# Patient Record
Sex: Male | Born: 1950 | Race: White | Hispanic: No | Smoking: Never smoker
Health system: Southern US, Community
[De-identification: ages and names within clinical notes are randomized; demographics above are authoritative.]

## PROBLEM LIST (undated history)

## (undated) DIAGNOSIS — E1121 Type 2 diabetes mellitus with diabetic nephropathy: Secondary | ICD-10-CM

## (undated) DIAGNOSIS — E11319 Type 2 diabetes mellitus with unspecified diabetic retinopathy without macular edema: Secondary | ICD-10-CM

## (undated) DIAGNOSIS — E119 Type 2 diabetes mellitus without complications: Secondary | ICD-10-CM

## (undated) DIAGNOSIS — I10 Essential (primary) hypertension: Secondary | ICD-10-CM

## (undated) DIAGNOSIS — Z87442 Personal history of urinary calculi: Secondary | ICD-10-CM

## (undated) DIAGNOSIS — L570 Actinic keratosis: Secondary | ICD-10-CM

## (undated) HISTORY — DX: Actinic keratosis: L57.0

## (undated) HISTORY — PX: EYE SURGERY: SHX253

## (undated) HISTORY — PX: CORONARY ANGIOPLASTY: SHX604

## (undated) HISTORY — PX: PILONIDAL CYST EXCISION: SHX744

---
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
## 2006-11-06 ENCOUNTER — Ambulatory Visit (HOSPITAL_COMMUNITY): Admission: RE | Admit: 2006-11-06 | Discharge: 2006-11-06 | Payer: Self-pay | Admitting: Ophthalmology

## 2006-11-06 IMAGING — CR DG CHEST 2V
2 series · 2 of 2 positions shown · non-contrast
Comparison: None.

CLINICAL DATA: Preop.
 CHEST ? 2 VIEW:

[view not recorded (1 of 2)]
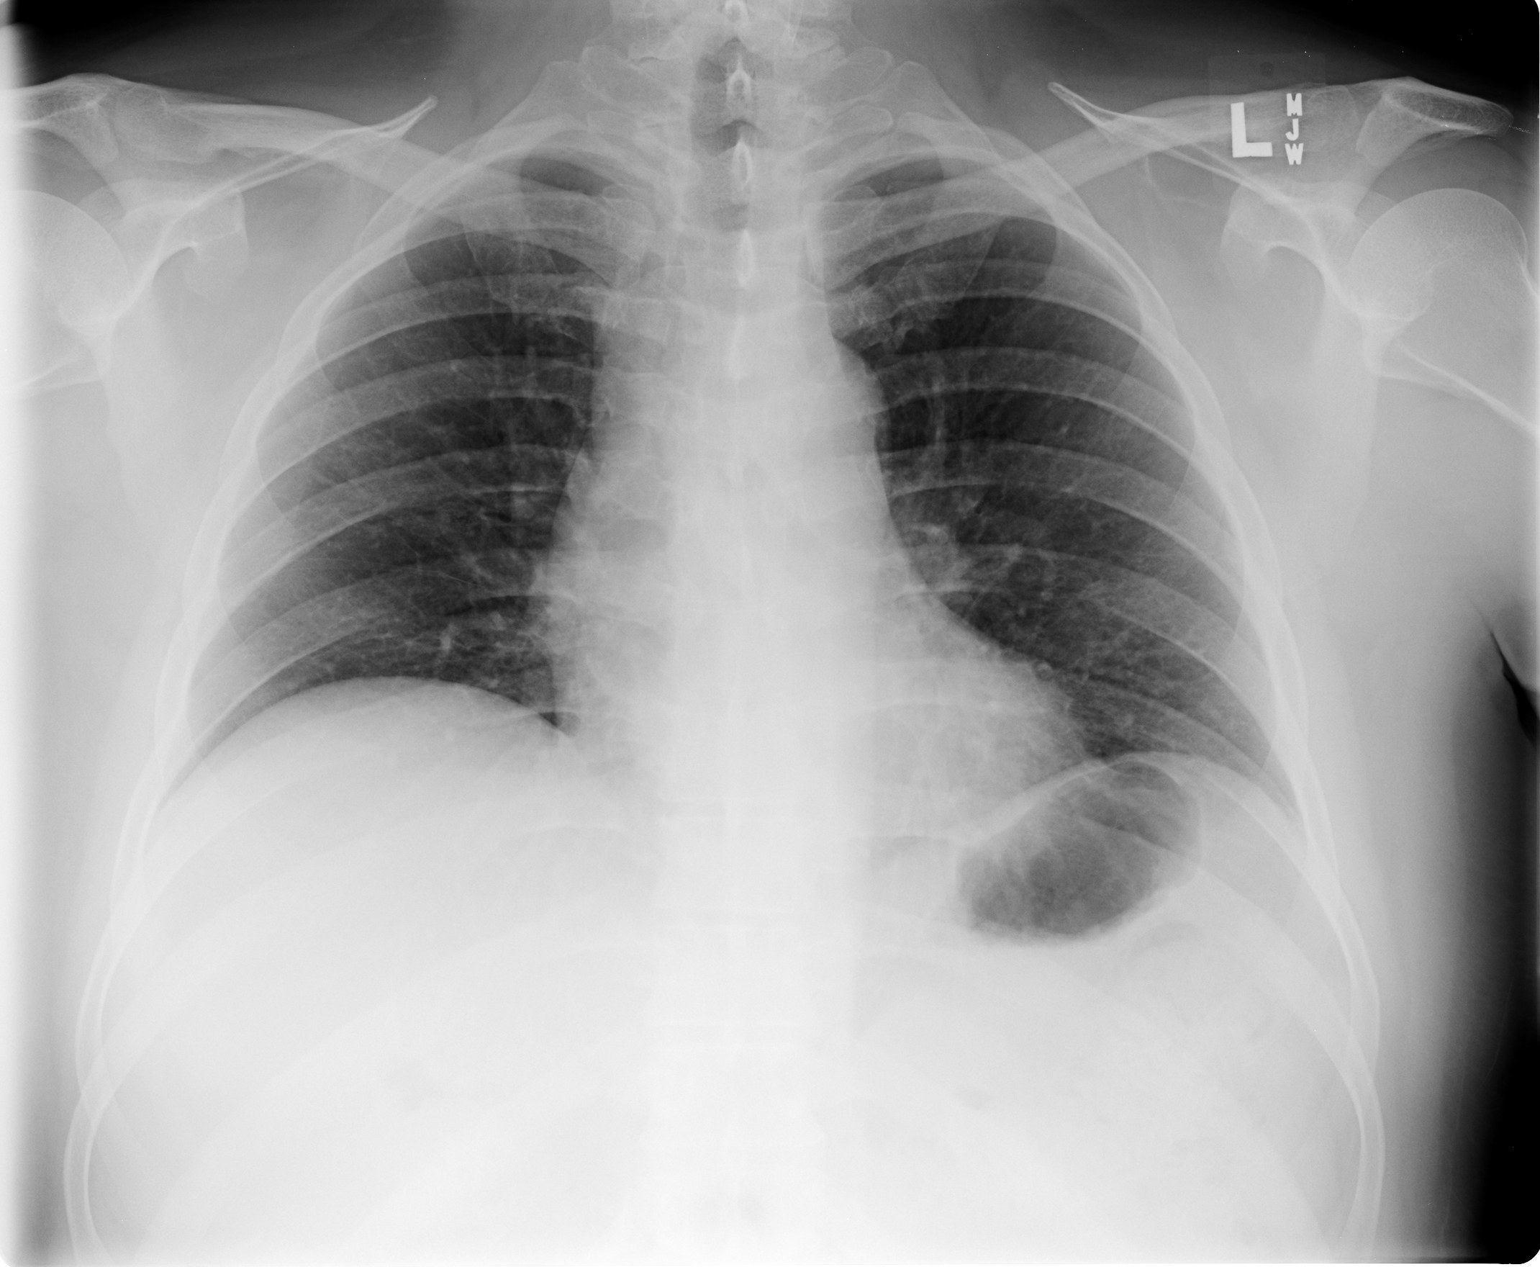

[view not recorded (2 of 2)]
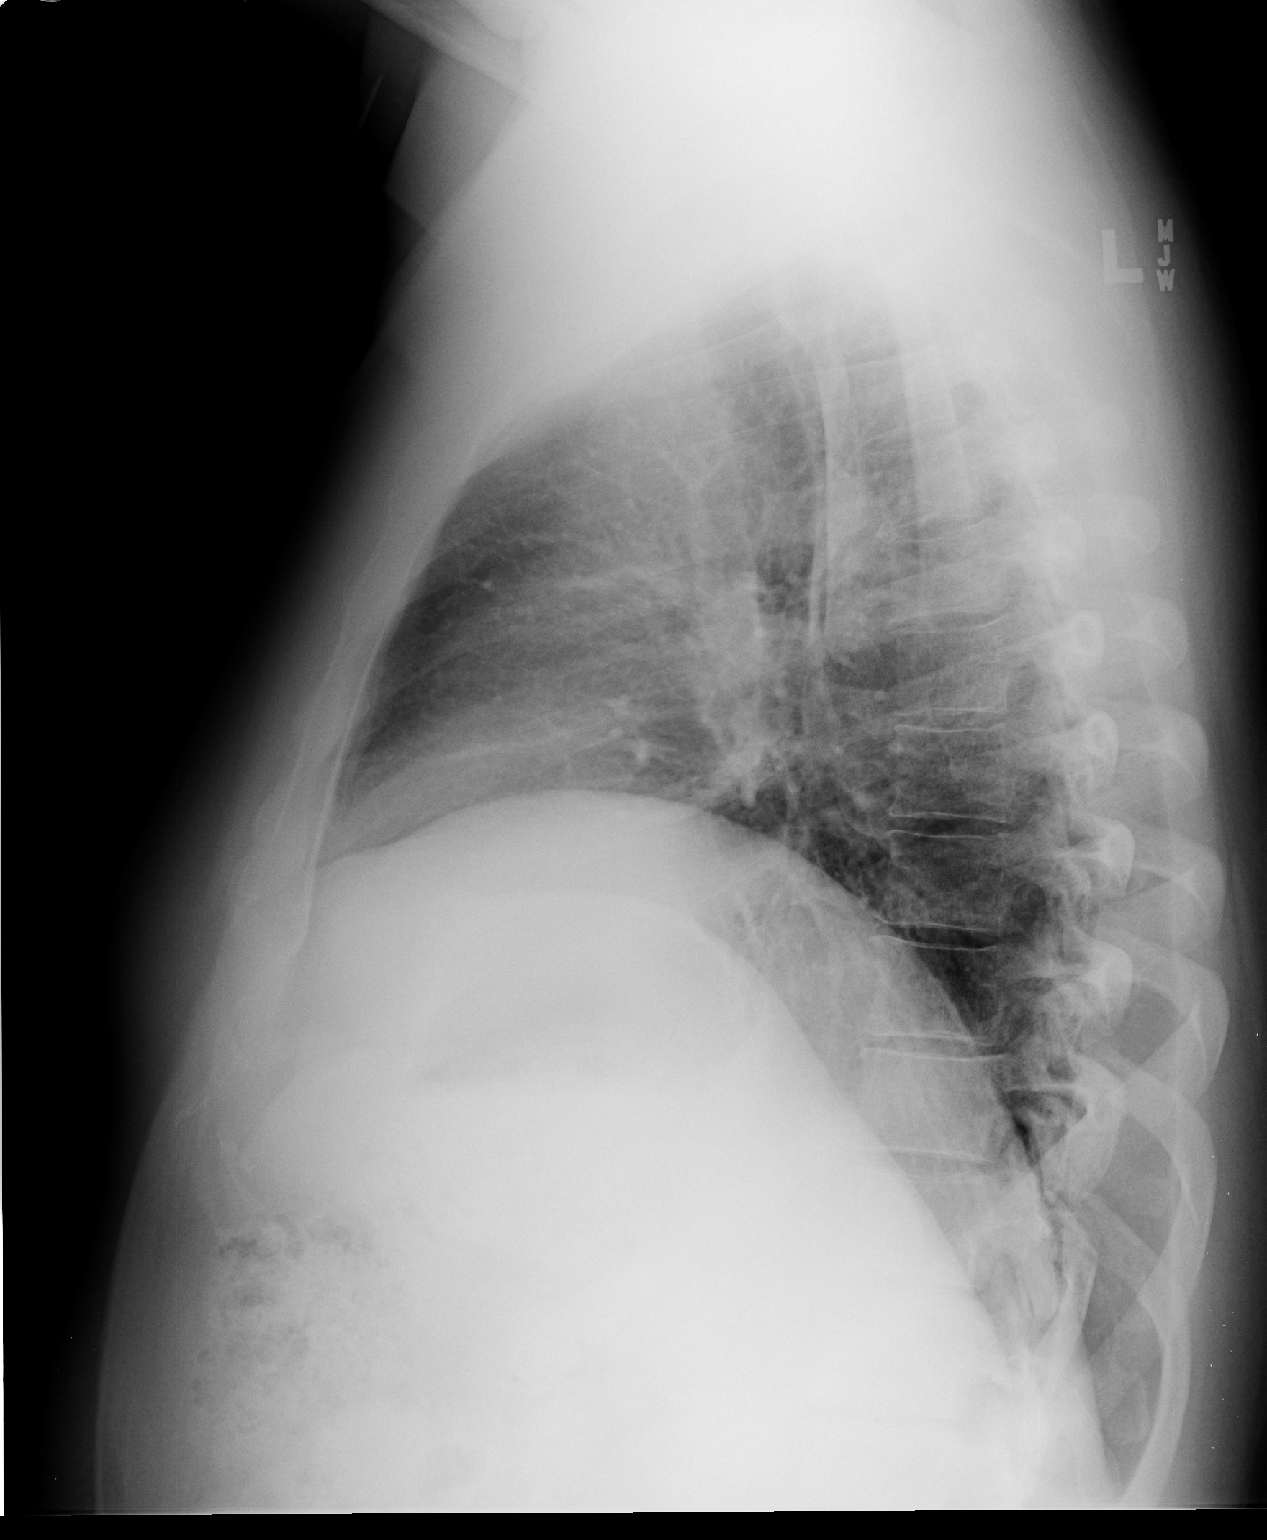

[2 of 2 positions shown; findings below may reference images not displayed]

FINDINGS: The trachea is midline.  Heart size is normal.  Lungs are low in volume but clear.  No pleural fluid.
IMPRESSION: No acute findings.

## 2008-09-23 ENCOUNTER — Ambulatory Visit: Payer: Self-pay | Admitting: Unknown Physician Specialty

## 2008-10-02 ENCOUNTER — Ambulatory Visit: Payer: Self-pay | Admitting: Unknown Physician Specialty

## 2014-07-04 DIAGNOSIS — I219 Acute myocardial infarction, unspecified: Secondary | ICD-10-CM

## 2014-07-04 HISTORY — DX: Acute myocardial infarction, unspecified: I21.9

## 2015-01-13 ENCOUNTER — Inpatient Hospital Stay
Admission: EM | Admit: 2015-01-13 | Discharge: 2015-01-16 | DRG: 247 | Disposition: A | Discharge status: Home / Self Care | Payer: 59 | Attending: Internal Medicine | Admitting: Internal Medicine

## 2015-01-13 ENCOUNTER — Encounter: Payer: Self-pay | Admitting: Emergency Medicine

## 2015-01-13 ENCOUNTER — Other Ambulatory Visit
Admission: RE | Admit: 2015-01-13 | Discharge: 2015-01-13 | Disposition: A | Discharge status: Home / Self Care | Payer: 59 | Source: Other Acute Inpatient Hospital | Attending: Physician Assistant | Admitting: Physician Assistant

## 2015-01-13 ENCOUNTER — Emergency Department: Payer: 59

## 2015-01-13 DIAGNOSIS — R6510 Systemic inflammatory response syndrome (SIRS) of non-infectious origin without acute organ dysfunction: Secondary | ICD-10-CM

## 2015-01-13 DIAGNOSIS — R7989 Other specified abnormal findings of blood chemistry: Secondary | ICD-10-CM

## 2015-01-13 DIAGNOSIS — M2550 Pain in unspecified joint: Secondary | ICD-10-CM

## 2015-01-13 DIAGNOSIS — I2510 Atherosclerotic heart disease of native coronary artery without angina pectoris: Secondary | ICD-10-CM | POA: Diagnosis present

## 2015-01-13 DIAGNOSIS — I10 Essential (primary) hypertension: Secondary | ICD-10-CM | POA: Diagnosis present

## 2015-01-13 DIAGNOSIS — R001 Bradycardia, unspecified: Secondary | ICD-10-CM | POA: Insufficient documentation

## 2015-01-13 DIAGNOSIS — R9431 Abnormal electrocardiogram [ECG] [EKG]: Secondary | ICD-10-CM | POA: Insufficient documentation

## 2015-01-13 DIAGNOSIS — I319 Disease of pericardium, unspecified: Secondary | ICD-10-CM

## 2015-01-13 DIAGNOSIS — I214 Non-ST elevation (NSTEMI) myocardial infarction: Secondary | ICD-10-CM | POA: Diagnosis present

## 2015-01-13 DIAGNOSIS — E785 Hyperlipidemia, unspecified: Secondary | ICD-10-CM | POA: Diagnosis present

## 2015-01-13 DIAGNOSIS — E119 Type 2 diabetes mellitus without complications: Secondary | ICD-10-CM | POA: Diagnosis present

## 2015-01-13 DIAGNOSIS — Z7982 Long term (current) use of aspirin: Secondary | ICD-10-CM | POA: Diagnosis not present

## 2015-01-13 DIAGNOSIS — R778 Other specified abnormalities of plasma proteins: Secondary | ICD-10-CM | POA: Diagnosis present

## 2015-01-13 HISTORY — DX: Essential (primary) hypertension: I10

## 2015-01-13 HISTORY — DX: Type 2 diabetes mellitus without complications: E11.9

## 2015-01-13 LAB — URINALYSIS COMPLETE WITH MICROSCOPIC (ARMC ONLY)
BILIRUBIN URINE: NEGATIVE
Bacteria, UA: NONE SEEN
Hgb urine dipstick: NEGATIVE
Leukocytes, UA: NEGATIVE
Nitrite: NEGATIVE
PH: 5 (ref 5.0–8.0)
Protein, ur: NEGATIVE mg/dL
Specific Gravity, Urine: 1.025 (ref 1.005–1.030)
Squamous Epithelial / LPF: NONE SEEN

## 2015-01-13 LAB — BASIC METABOLIC PANEL
Anion gap: 8 (ref 5–15)
BUN: 33 mg/dL — AB (ref 6–20)
CHLORIDE: 103 mmol/L (ref 101–111)
CO2: 25 mmol/L (ref 22–32)
CREATININE: 1.45 mg/dL — AB (ref 0.61–1.24)
Calcium: 9.2 mg/dL (ref 8.9–10.3)
GFR, EST AFRICAN AMERICAN: 58 mL/min — AB (ref >60)
GFR, EST NON AFRICAN AMERICAN: 50 mL/min — AB (ref >60)
Glucose, Bld: 193 mg/dL — ABNORMAL HIGH (ref 65–99)
Potassium: 4.3 mmol/L (ref 3.5–5.1)
Sodium: 136 mmol/L (ref 135–145)

## 2015-01-13 LAB — CK: Total CK: 139 U/L (ref 49–397)

## 2015-01-13 LAB — CBC
HCT: 41.5 % (ref 40.0–52.0)
HEMOGLOBIN: 13.9 g/dL (ref 13.0–18.0)
MCH: 29.6 pg (ref 26.0–34.0)
MCHC: 33.6 g/dL (ref 32.0–36.0)
MCV: 88.3 fL (ref 80.0–100.0)
PLATELETS: 263 10*3/uL (ref 150–440)
RBC: 4.7 MIL/uL (ref 4.40–5.90)
RDW: 13.3 % (ref 11.5–14.5)
WBC: 7.6 10*3/uL (ref 3.8–10.6)

## 2015-01-13 LAB — APTT: APTT: 29 s (ref 24–36)

## 2015-01-13 LAB — TROPONIN I
TROPONIN I: 5.41 ng/mL — AB (ref <0.031)
Troponin I: 4.66 ng/mL — ABNORMAL HIGH (ref <0.031)

## 2015-01-13 LAB — HEPATIC FUNCTION PANEL
ALBUMIN: 4 g/dL (ref 3.5–5.0)
ALK PHOS: 56 U/L (ref 38–126)
ALT: 25 U/L (ref 17–63)
AST: 34 U/L (ref 15–41)
BILIRUBIN TOTAL: 0.8 mg/dL (ref 0.3–1.2)
Bilirubin, Direct: <0.1 mg/dL — ABNORMAL LOW (ref 0.1–0.5)
TOTAL PROTEIN: 7.8 g/dL (ref 6.5–8.1)

## 2015-01-13 LAB — PROTIME-INR
INR: 0.97
PROTHROMBIN TIME: 13.1 s (ref 11.4–15.0)

## 2015-01-13 LAB — CKMB (ARMC ONLY): CK, MB: 7.8 ng/mL — AB (ref 0.5–5.0)

## 2015-01-13 LAB — LACTIC ACID, PLASMA: Lactic Acid, Venous: 1.3 mmol/L (ref 0.5–2.0)

## 2015-01-13 IMAGING — CR DG CHEST 2V
2 series · 2 of 2 positions shown · non-contrast
Comparison: 11/06/2006

CLINICAL DATA: Abnormal EKG. Elevated cardiac enzymes. Diabetes and
hypertension.

EXAM:
CHEST  2 VIEW

[chest pa]
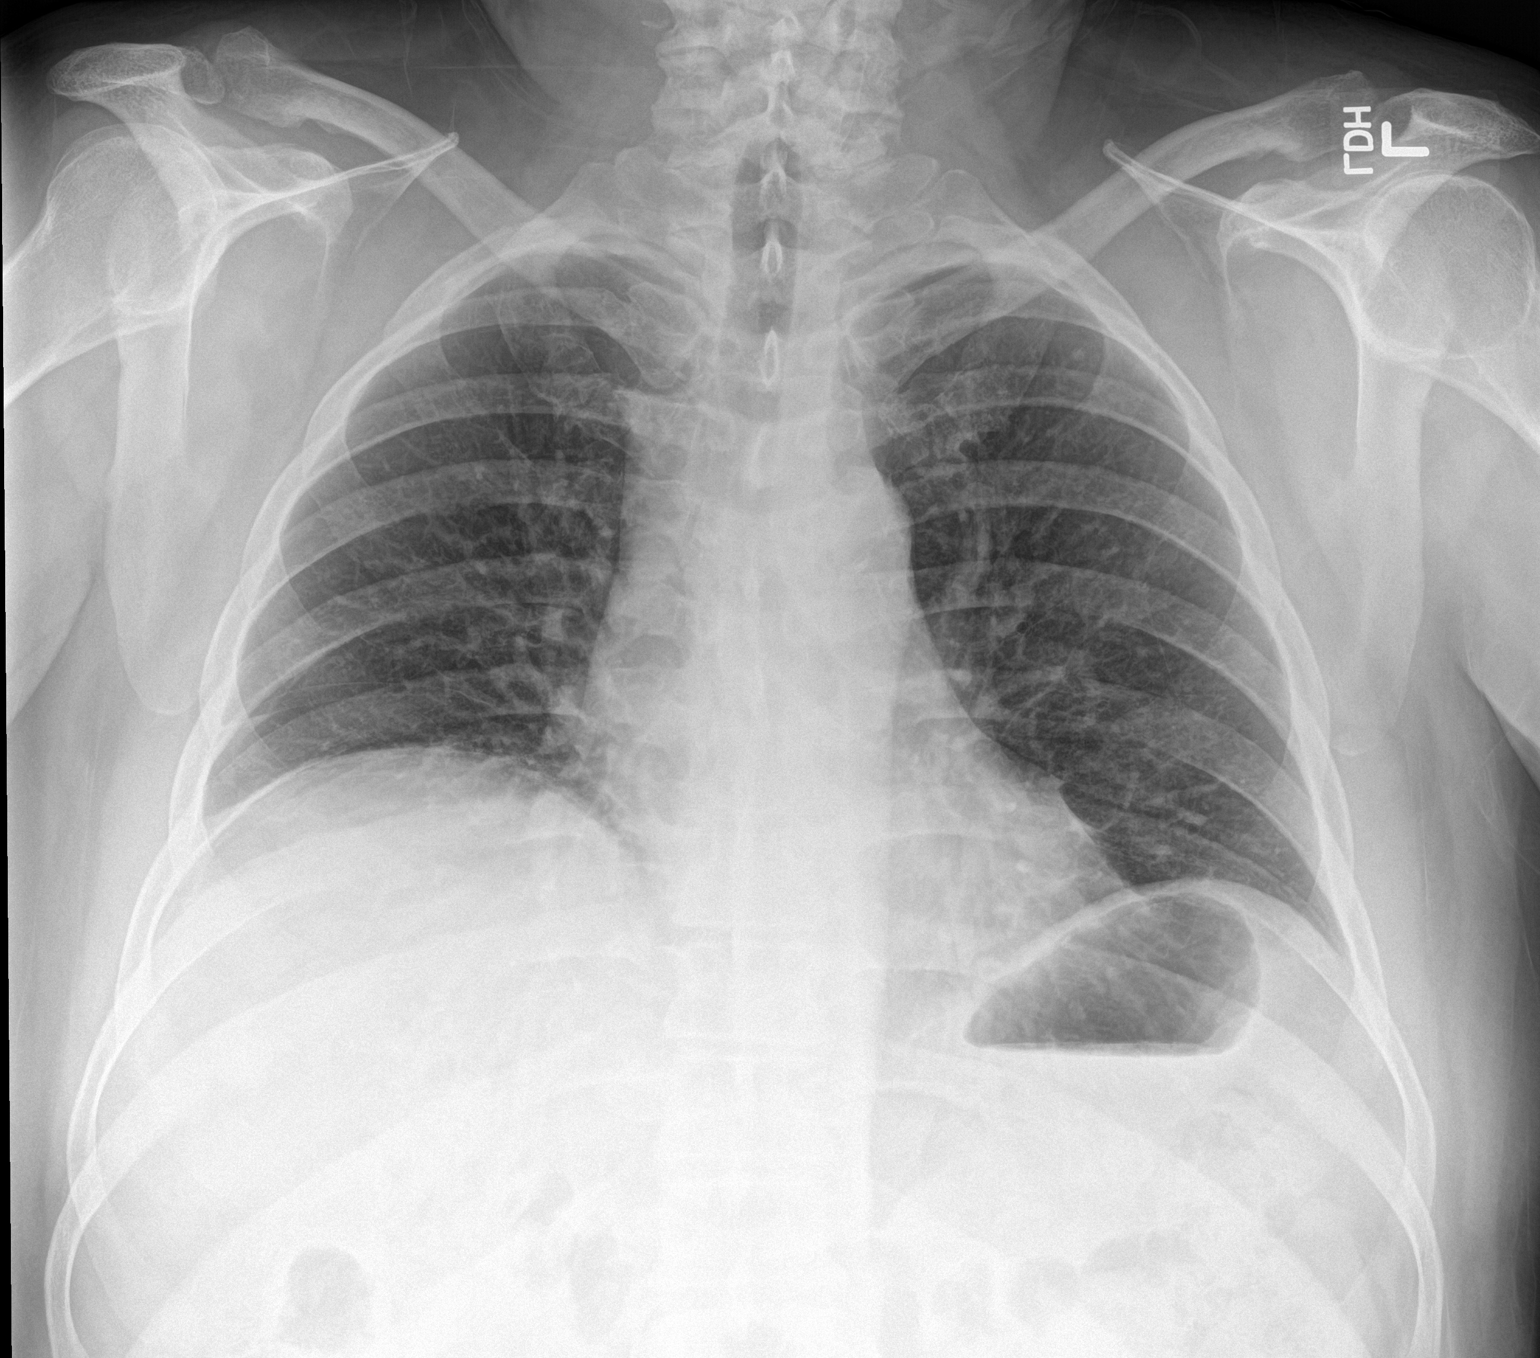

[chest lat]
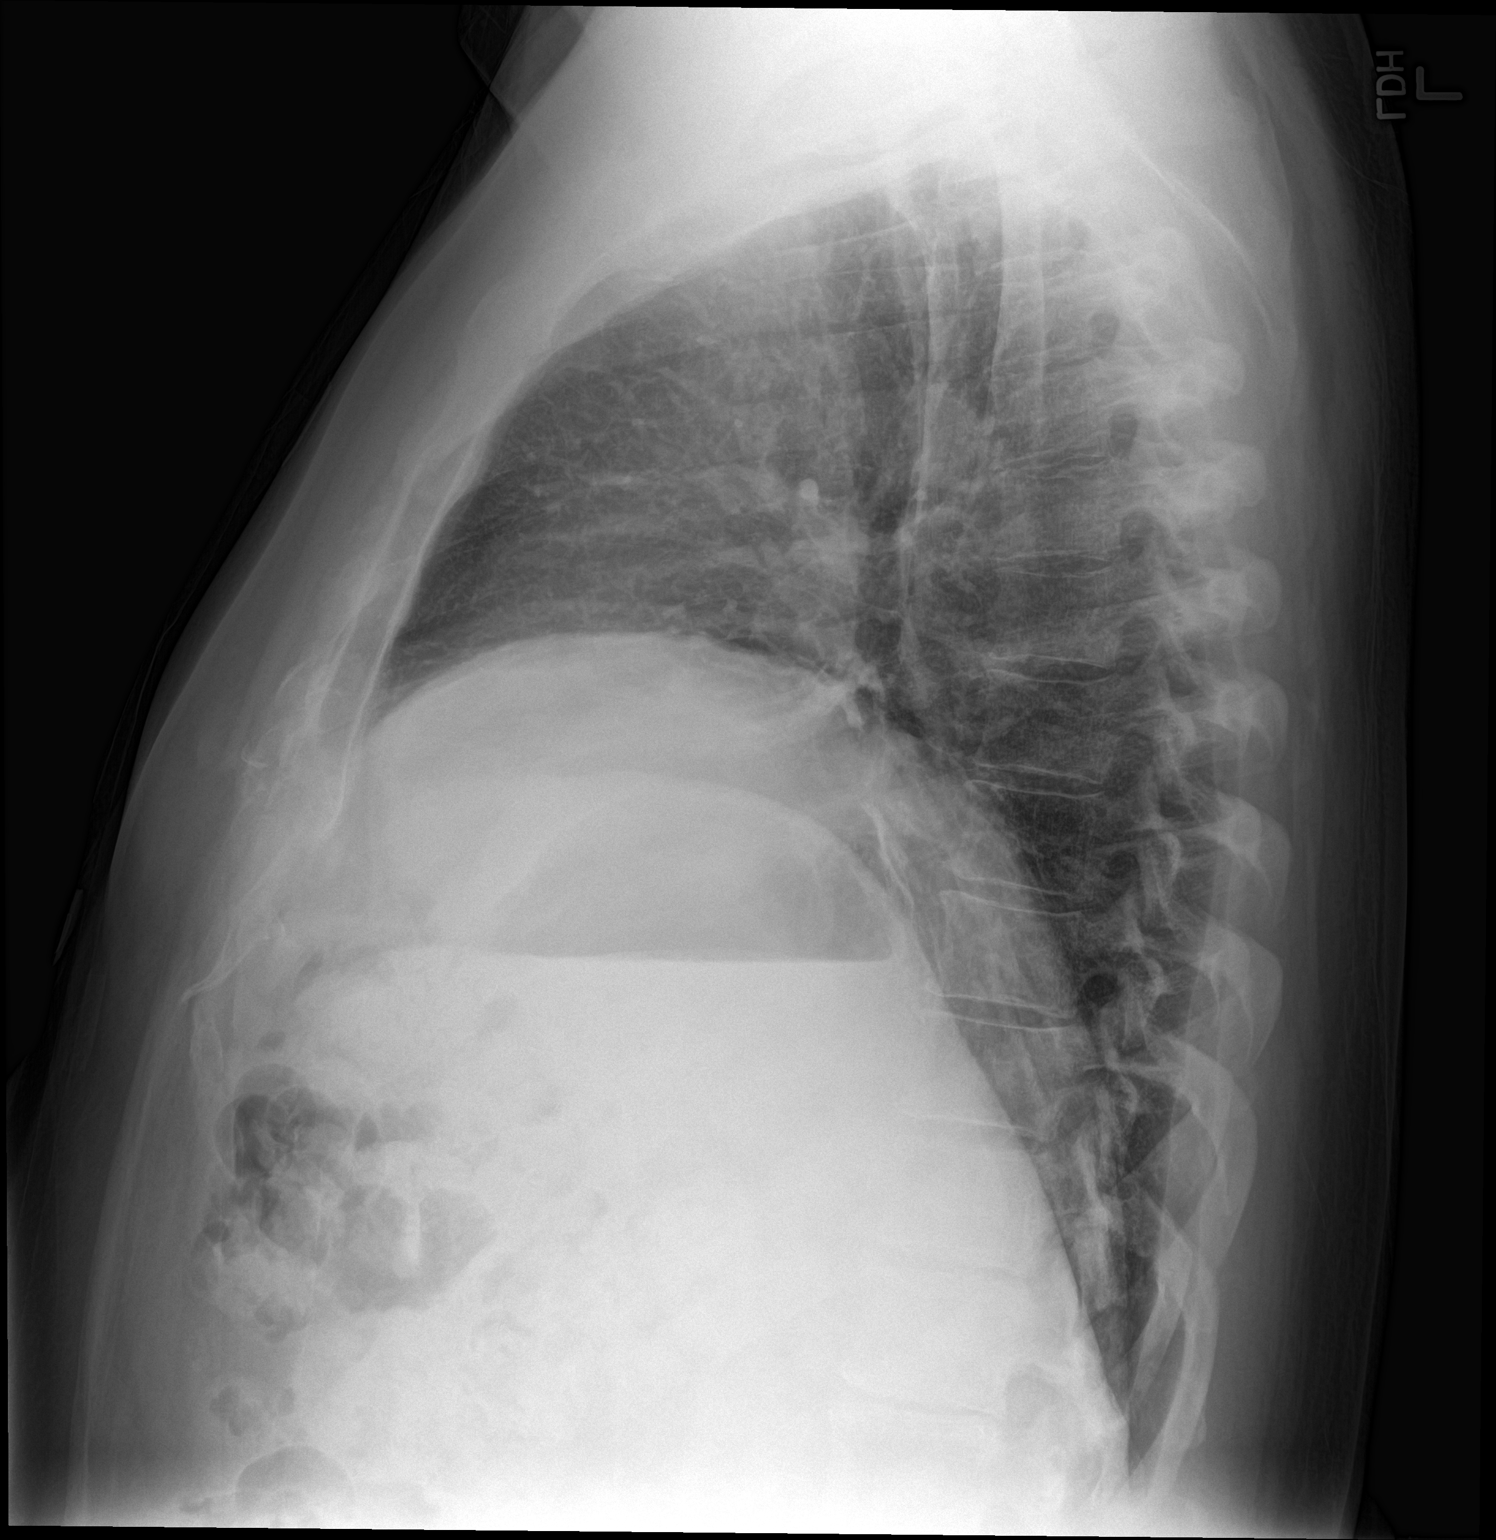

[2 of 2 positions shown; findings below may reference images not displayed]

FINDINGS: Low lung volumes again noted, however both lungs are clear. No
evidence of pleural effusion. Heart size is within normal limits. No
definite masses or lymphadenopathy identified.
IMPRESSION: Low lung volumes.  No active disease.

## 2015-01-13 MED ORDER — ASPIRIN EC 81 MG PO TBEC
81.0000 mg | DELAYED_RELEASE_TABLET | Freq: Every day | ORAL | Status: DC
  Administered 2015-01-14: 81 mg via ORAL
  Filled 2015-01-13: qty 1

## 2015-01-13 MED ORDER — GLIMEPIRIDE 2 MG PO TABS
2.0000 mg | ORAL_TABLET | Freq: Every day | ORAL | Status: DC
  Administered 2015-01-14 – 2015-01-16 (×3): 2 mg via ORAL
  Filled 2015-01-13 (×3): qty 1

## 2015-01-13 MED ORDER — BRIMONIDINE TARTRATE 0.2 % OP SOLN
1.0000 [drp] | Freq: Two times a day (BID) | OPHTHALMIC | Status: DC
  Administered 2015-01-14 – 2015-01-16 (×4): 1 [drp] via OPHTHALMIC
  Filled 2015-01-13: qty 5

## 2015-01-13 MED ORDER — SODIUM CHLORIDE 0.9 % IV BOLUS (SEPSIS)
1000.0000 mL | Freq: Once | INTRAVENOUS | Status: AC
  Administered 2015-01-13: 1000 mL via INTRAVENOUS
  Filled 2015-01-13: qty 1000

## 2015-01-13 MED ORDER — PIOGLITAZONE HCL 30 MG PO TABS
30.0000 mg | ORAL_TABLET | Freq: Every day | ORAL | Status: DC
  Administered 2015-01-14: 30 mg via ORAL
  Filled 2015-01-13: qty 1

## 2015-01-13 MED ORDER — VANCOMYCIN HCL IN DEXTROSE 1-5 GM/200ML-% IV SOLN
1000.0000 mg | Freq: Once | INTRAVENOUS | Status: AC
  Administered 2015-01-13: 1000 mg via INTRAVENOUS
  Filled 2015-01-13: qty 200

## 2015-01-13 MED ORDER — INSULIN ASPART 100 UNIT/ML ~~LOC~~ SOLN
0.0000 [IU] | Freq: Three times a day (TID) | SUBCUTANEOUS | Status: DC
  Filled 2015-01-13: qty 2

## 2015-01-13 MED ORDER — ASPIRIN 81 MG PO CHEW
324.0000 mg | CHEWABLE_TABLET | Freq: Once | ORAL | Status: AC
  Administered 2015-01-13: 324 mg via ORAL
  Filled 2015-01-13: qty 4

## 2015-01-13 MED ORDER — ASPIRIN EC 81 MG PO TBEC: 81.0000 mg | DELAYED_RELEASE_TABLET | Freq: Every day | ORAL | Status: DC

## 2015-01-13 MED ORDER — ACETAMINOPHEN 325 MG PO TABS: 650.0000 mg | ORAL_TABLET | Freq: Four times a day (QID) | ORAL | Status: DC | PRN

## 2015-01-13 MED ORDER — SODIUM CHLORIDE 0.9 % IJ SOLN
3.0000 mL | INTRAMUSCULAR | Status: DC | PRN
  Administered 2015-01-14: 3 mL via INTRAVENOUS
  Filled 2015-01-13: qty 10

## 2015-01-13 MED ORDER — SODIUM CHLORIDE 0.9 % IJ SOLN
3.0000 mL | Freq: Two times a day (BID) | INTRAMUSCULAR | Status: DC
  Administered 2015-01-13 – 2015-01-16 (×5): 3 mL via INTRAVENOUS

## 2015-01-13 MED ORDER — VALSARTAN 160 MG PO TABS
160.0000 mg | ORAL_TABLET | Freq: Every day | ORAL | Status: DC
  Administered 2015-01-15 – 2015-01-16 (×2): 160 mg via ORAL
  Filled 2015-01-13 (×3): qty 1

## 2015-01-13 MED ORDER — VALSARTAN-HYDROCHLOROTHIAZIDE 160-12.5 MG PO TABS: 1.0000 | ORAL_TABLET | Freq: Every day | ORAL | Status: DC

## 2015-01-13 MED ORDER — METFORMIN HCL 500 MG PO TABS
1000.0000 mg | ORAL_TABLET | Freq: Two times a day (BID) | ORAL | Status: DC
  Administered 2015-01-14 (×2): 1000 mg via ORAL
  Filled 2015-01-13 (×2): qty 2

## 2015-01-13 MED ORDER — LIRAGLUTIDE 18 MG/3ML ~~LOC~~ SOPN
1.8000 mg | PEN_INJECTOR | Freq: Every day | SUBCUTANEOUS | Status: DC
  Administered 2015-01-14 – 2015-01-15 (×2): 1.8 mg via SUBCUTANEOUS
  Filled 2015-01-13 (×3): qty 0.3

## 2015-01-13 MED ORDER — ACETAMINOPHEN 650 MG RE SUPP
650.0000 mg | Freq: Four times a day (QID) | RECTAL | Status: DC | PRN
Start: 2015-01-13 — End: 2015-01-16

## 2015-01-13 MED ORDER — HYDROCHLOROTHIAZIDE 12.5 MG PO CAPS: 12.5000 mg | ORAL_CAPSULE | Freq: Every day | ORAL | Status: DC

## 2015-01-13 MED ORDER — PIPERACILLIN-TAZOBACTAM 3.375 G IVPB
3.3750 g | Freq: Once | INTRAVENOUS | Status: AC
  Administered 2015-01-13: 3.375 g via INTRAVENOUS
  Filled 2015-01-13: qty 50

## 2015-01-13 MED ORDER — ADULT MULTIVITAMIN W/MINERALS CH
1.0000 | ORAL_TABLET | Freq: Every day | ORAL | Status: DC
  Administered 2015-01-14 – 2015-01-16 (×3): 1 via ORAL
  Filled 2015-01-13 (×3): qty 1

## 2015-01-13 MED ORDER — ONDANSETRON HCL 4 MG PO TABS: 4.0000 mg | ORAL_TABLET | Freq: Four times a day (QID) | ORAL | Status: DC | PRN

## 2015-01-13 MED ORDER — CANAGLIFLOZIN 100 MG PO TABS
300.0000 mg | ORAL_TABLET | Freq: Every day | ORAL | Status: DC
  Administered 2015-01-14 – 2015-01-16 (×3): 300 mg via ORAL
  Filled 2015-01-13 (×3): qty 3

## 2015-01-13 MED ORDER — ENOXAPARIN SODIUM 40 MG/0.4ML ~~LOC~~ SOLN
40.0000 mg | SUBCUTANEOUS | Status: DC
  Administered 2015-01-13: 40 mg via SUBCUTANEOUS
  Filled 2015-01-13: qty 0.4

## 2015-01-13 MED ORDER — DOXYCYCLINE HYCLATE 100 MG PO TABS
100.0000 mg | ORAL_TABLET | Freq: Two times a day (BID) | ORAL | Status: DC
  Administered 2015-01-14 (×2): 100 mg via ORAL
  Filled 2015-01-13 (×3): qty 1

## 2015-01-13 MED ORDER — ONDANSETRON HCL 4 MG/2ML IJ SOLN: 4.0000 mg | Freq: Four times a day (QID) | INTRAMUSCULAR | Status: DC | PRN

## 2015-01-13 MED ORDER — OXYCODONE HCL 5 MG PO TABS: 5.0000 mg | ORAL_TABLET | ORAL | Status: DC | PRN

## 2015-01-13 MED ORDER — ATORVASTATIN CALCIUM 20 MG PO TABS
20.0000 mg | ORAL_TABLET | Freq: Every day | ORAL | Status: DC
  Administered 2015-01-14: 20 mg via ORAL
  Filled 2015-01-13: qty 1

## 2015-01-13 MED ORDER — ASPIRIN EC 81 MG PO TBEC
DELAYED_RELEASE_TABLET | ORAL | Status: AC
  Filled 2015-01-13: qty 4

## 2015-01-13 NOTE — ED Provider Notes (Signed)
[facility] Emergency Department Provider Note  ____________________________________________  Time seen: Approximately 3:55 PM  I have reviewed the triage vital signs and the nursing notes.   HISTORY  Chief Complaint Abnormal ECG and Abnormal Lab    HPI Delshon Harrison is a 64 y.o. male with history of diabetes, hypertension, hyperlipidemia presents for evaluation of abnormal EKG as well as troponin elevation. The patient reports that over the past 3 days he has developed joint pain, myalgias as well as intermittent left arm pain. He was seen by his primary care doctor today and started on antibiotics for possible tick fever since he had been working outside quite a bit over the past week. He reports that he received a phone call just prior to arrival that he should return to the emergency department for abnormal troponin and abnormal EKG. He denies any chest pain, shortness of breath or exertional complaints at any time including now. He has had subjective fever last week. No nausea, vomiting, diarrhea. Currently he feels quite well. Current severity 0 out of 10. No modifying factors.   Past Medical History  Diagnosis Date  . Diabetes mellitus without complication   . Hypertension     There are no active problems to display for this patient.   Past Surgical History  Procedure Laterality Date  . Eye surgery      No current outpatient prescriptions on file.  Allergies Trulicity  No family history on file.  Social History History  Substance Use Topics  . Smoking status: Never Smoker   . Smokeless tobacco: Not on file  . Alcohol Use: No    Review of Systems Constitutional: + fever/chills Eyes: No visual changes. ENT: No sore throat. Cardiovascular: Denies chest pain. Respiratory: Denies shortness of breath. Gastrointestinal: No abdominal pain.  No nausea, no vomiting.  No diarrhea.  No constipation. Genitourinary: Negative for  dysuria. Musculoskeletal: Negative for back pain. Skin: Negative for rash. Neurological: Negative for headaches, focal weakness or numbness.  10-point ROS otherwise negative.  ____________________________________________   PHYSICAL EXAM:  VITAL SIGNS: ED Triage Vitals  Enc Vitals Group     BP 01/13/15 1546 123/74 mmHg     Pulse Rate 01/13/15 1546 118     Resp 01/13/15 1546 16     Temp 01/13/15 1546 97.5 F (36.4 C)     Temp Source 01/13/15 1546 Oral     SpO2 01/13/15 1546 94 %     Weight 01/13/15 1550 226 lb (102.513 kg)     Height 01/13/15 1550  (1.778 m)     Head Cir --      Peak Flow --      Pain Score 01/13/15 1550 0     Pain Loc --      Pain Edu? --      Excl. in GC? --     Constitutional: Alert and oriented. Well appearing and in no acute distress. Eyes: Conjunctivae are normal. PERRL. EOMI. Head: Atraumatic. Nose: No congestion/rhinnorhea. Mouth/Throat: Mucous membranes are moist.  Oropharynx non-erythematous. Neck: No stridor.  Cardiovascular: Tachycardic rate, regular rhythm. Grossly normal heart sounds.  Good peripheral circulation. Respiratory: Normal respiratory effort.  No retractions. Lungs CTAB. Gastrointestinal: Soft and nontender. No distention. No abdominal bruits. No CVA tenderness. Genitourinary: deferred Musculoskeletal: No lower extremity tenderness nor edema.  No joint effusions. Neurologic:  Normal speech and language. No gross focal neurologic deficits are appreciated. Speech is normal. No gait instability. Skin:  Skin is warm, dry and intact.  No rash noted. Psychiatric: Mood and affect are normal. Speech and behavior are normal.  ____________________________________________   LABS (all labs ordered are listed, but only abnormal results are displayed)  Labs Reviewed  BASIC METABOLIC PANEL - Abnormal; Notable for the following:    Glucose, Bld 193 (*)    BUN 33 (*)    Creatinine, Ser 1.45 (*)    GFR calc non Af Amer 50 (*)    GFR  calc Af Amer 58 (*)    All other components within normal limits  TROPONIN I - Abnormal; Notable for the following:    Troponin I 4.66 (*)    All other components within normal limits  CULTURE, BLOOD (ROUTINE X 2)  CULTURE, BLOOD (ROUTINE X 2)  CBC  LACTIC ACID, PLASMA  PROTIME-INR  APTT  LACTIC ACID, PLASMA  CK  HEPATIC FUNCTION PANEL  ROCKY MTN SPOTTED FVR ABS PNL(IGG+IGM)  B. BURGDORFI ANTIBODIES   ____________________________________________  EKG  ED ECG REPORT I, Gayla Doss, the attending physician, personally viewed and interpreted this ECG.   Date: 01/13/2015  EKG Time: 15:50  Rate: 108  Rhythm: sinus tachycardia  Axis: normal  Intervals:none  ST&T Change: Discordant ST elevation in V1, V2, V3, V4, less than a millimeter concordant ST elevation in aVL, no reciprocal ST depression, slight PR depression in lead II, lead I  ____________________________________________  RADIOLOGY  CXR FINDINGS: Low lung volumes again noted, however both lungs are clear. No evidence of pleural effusion. Heart size is within normal limits. No definite masses or lymphadenopathy identified.  IMPRESSION: Low lung volumes. No active disease.   ____________________________________________   PROCEDURES  Procedure(s) performed: None  Critical Care performed: Yes, see critical care note(s).   ____________________________________________   INITIAL IMPRESSION / ASSESSMENT AND PLAN / ED COURSE  Pertinent labs & imaging results that were available during my care of the patient were reviewed by me and considered in my medical decision making (see chart for details).  ----------------------------------------- 4:29 PM on 01/13/2015 ----------------------------------------- Jason Harrison is a 64 y.o. male with history of diabetes, hypertension, hyperlipidemia presents for evaluation of abnormal EKG as well as troponin elevation in the setting of myalgias and low grade fevers.  Troponin at [facility] clinic this morning was 5.48. Denis any chest pain, or SOB, no exertional complaints at any time. On arrival to the emergency department, EKG concerning for ST elevation. I discussed this with Dr. Ihor Austin, on-call for cardiology, and he has reviewed the EKG, reports EKG is not consistent with STEMI and appears more consistent with pericarditis and as the patient is asymptomatic at this time, will not initiate code stemi as discussed with him. We'll repeat troponin, obtain basic labs, blood cultures, chest x-ray, urinalysis, give antibiotics and anticipate admission.  ----------------------------------------- 6:01 PM on 01/13/2015 -----------------------------------------  Troponin 4.66, downtrending from prior which is reassuring. Patient still denies any chest pain or difficulty breathing and currently feels well. Heart rate improving with the above treatment, reassuring lactic acid, no leukocytosis. Will not treat with full 30 mL/kg bolus of normal saline given concern for myocarditis given significant troponin elevation, risk for possible myocarditis with underlying cardiomyopathy. Lyme disease and RMSF titer sent. Discussed with hospitalist for admission. Aspirin given.  ____________________________________________   FINAL CLINICAL IMPRESSION(S) / ED DIAGNOSES  Final diagnoses:  SIRS (systemic inflammatory response syndrome)  Pericarditis  Joint pain  Elevated troponin level      Gayla Doss, MD 01/13/15 1806

## 2015-01-13 NOTE — H&P (Signed)
[facility] - [place] at [facility]   PATIENT NAME: Jason Harrison    MR#:  [id number]  DATE OF BIRTH:  06-14-1951  DATE OF ADMISSION:  01/13/2015  PRIMARY CARE PHYSICIAN: Barbette Reichmann, MD   REQUESTING/REFERRING PHYSICIAN: Dr Inocencio Homes  CHIEF COMPLAINT:   Bilateral arm pain. HISTORY OF PRESENT ILLNESS:  Jason Harrison  is a 64 y.o. male with a known history of hypertension, type 2 diabetes, hyperlipidemia comes to the emergency room after he was seen at PCPs office for bilateral arm pain and possible tick bite fever. Patient was discharged home after he had blood work and EKG done. He was called to come to the emergency room because troponin was found to be elevated at 5.41. Patient denies any chest pain shortness of breath cough or fever. He's been complaining of myalgias overall. Denies any fever. Denies any nausea vomiting or diarrhea.  PAST MEDICAL HISTORY:   Past Medical History  Diagnosis Date  . Diabetes mellitus without complication   . Hypertension     PAST SURGICAL HISTOIRY:   Past Surgical History  Procedure Laterality Date  . Eye surgery      SOCIAL HISTORY:   History  Substance Use Topics  . Smoking status: Never Smoker   . Smokeless tobacco: Not on file  . Alcohol Use: No    FAMILY HISTORY:  No family history on file.  DRUG ALLERGIES:   Allergies  Allergen Reactions  . Trulicity [Dulaglutide]     REVIEW OF SYSTEMS:  Review of Systems  Constitutional: Positive for malaise/fatigue. Negative for fever, chills and diaphoresis.  HENT: Negative for congestion, ear pain, hearing loss, nosebleeds and sore throat.   Eyes: Negative for blurred vision, double vision, photophobia and pain.  Respiratory: Negative for hemoptysis, sputum production, wheezing and stridor.   Cardiovascular: Negative for orthopnea, claudication and leg swelling.  Gastrointestinal: Negative for heartburn and abdominal pain.  Genitourinary: Negative for  dysuria and frequency.  Musculoskeletal: Positive for myalgias. Negative for back pain, joint pain and neck pain.  Skin: Negative for rash.  Neurological: Positive for weakness. Negative for tingling, sensory change, speech change, focal weakness, seizures and headaches.  Endo/Heme/Allergies: Does not bruise/bleed easily.  Psychiatric/Behavioral: Negative for memory loss. The patient is not nervous/anxious.   All other systems reviewed and are negative.    MEDICATIONS AT HOME:   Prior to Admission medications   Not on File      VITAL SIGNS:  Blood pressure 98/76, pulse 96, temperature 97.5 F (36.4 C), temperature source Oral, resp. rate 18, height  (1.778 m), weight 102.513 kg (226 lb), SpO2 95 %.  PHYSICAL EXAMINATION:  GENERAL:  64 y.o.-year-old patient lying in the bed with no acute distress.  EYES: Pupils equal, round, reactive to light and accommodation. No scleral icterus. Extraocular muscles intact.  HEENT: Head atraumatic, normocephalic. Oropharynx and nasopharynx clear.  NECK:  Supple, no jugular venous distention. No thyroid enlargement, no tenderness.  LUNGS: Normal breath sounds bilaterally, no wheezing, rales,rhonchi or crepitation. No use of accessory muscles of respiration.  CARDIOVASCULAR: S1, S2 normal. No murmurs, rubs, or gallops.  ABDOMEN: Soft, nontender, nondistended. Bowel sounds present. No organomegaly or mass.  EXTREMITIES: No pedal edema, cyanosis, or clubbing.  NEUROLOGIC: Cranial nerves II through XII are intact. Muscle strength 5/5 in all extremities. Sensation intact. Gait not checked.  PSYCHIATRIC: The patient is alert and oriented x 3.  SKIN: No obvious rash, lesion, or ulcer.   LABORATORY PANEL:   CBC  Recent Labs Lab 01/13/15 1640  WBC 7.6  HGB 13.9  HCT 41.5  PLT 263   ------------------------------------------------------------------------------------------------------------------  Chemistries   Recent Labs Lab  01/13/15 1640  NA 136  K 4.3  CL 103  CO2 25  GLUCOSE 193*  BUN 33*  CREATININE 1.45*  CALCIUM 9.2   ------------------------------------------------------------------------------------------------------------------  Cardiac Enzymes  Recent Labs Lab 01/13/15 1640  TROPONINI 4.66*   ------------------------------------------------------------------------------------------------------------------  RADIOLOGY:  Dg Chest 2 View  01/13/2015   CLINICAL DATA:  Abnormal EKG. Elevated cardiac enzymes. Diabetes and hypertension.  EXAM: CHEST  2 VIEW  COMPARISON:  11/06/2006  FINDINGS: Low lung volumes again noted, however both lungs are clear. No evidence of pleural effusion. Heart size is within normal limits. No definite masses or lymphadenopathy identified.  IMPRESSION: Low lung volumes.  No active disease.   Electronically Signed   By: Myles Rosenthal M.D.   On: 01/13/2015 16:37    EKG:    s tachycardia with diffuse ST-T elevation IMPRESSION AND PLAN:  64 year old Caucasian gentleman with history of type 2 diabetes, hypertension, hyperlipidemia comes into the emergency room with #1 elevated troponin and abnormal EKG. Patient is being admitted for possible pericarditis mild acute Symptoms of body aches, myalgia and profuse sweating. Denies any chest pain shortness of breath or cough. Patient's EKG was evaluated by Dr. Lady Gary and recommends to treat as possible pericarditis. IV heparin or Lovenox not recommended per cardiology. We'll continue aspirin daily. Cycle cardiac enzymes Echo in the morning Cardiology to see in the morning.  #2 outpatient treatment for possible tick bite fever Continue patient's on doxycycline  #3 type 2 diabetes Sliding scale insulin and continue with patient's home meds. Patient prefers to take his own Invokana.  #4 HL Cont statins  Case was d/w Dr Lady Gary  All the records are reviewed and case discussed with ED provider. Management plans discussed with  the patient, family and they are in agreement.  CODE STATUS: full  TOTAL TIME TAKING CARE OF THIS PATIENT: 50 minutes.    Jason Harrison M.D on 01/13/2015 at 6:06 PM  Between 7am to 6pm - Pager - [phone]  After 6pm go to [email or web address] - password [email or web address] [facility]  [facility]  Office  [phone]  CC: Primary care physician; Barbette Reichmann, MD

## 2015-01-13 NOTE — ED Notes (Signed)
Pt seen at [facility] today; told he had abnormal EKG and abnormal troponin level; called and was told to come to ED.

## 2015-01-14 ENCOUNTER — Inpatient Hospital Stay
Admit: 2015-01-14 | Discharge: 2015-01-14 | Disposition: A | Discharge status: Home / Self Care | Payer: 59 | Attending: Internal Medicine | Admitting: Internal Medicine

## 2015-01-14 LAB — GLUCOSE, CAPILLARY
GLUCOSE-CAPILLARY: 110 mg/dL — AB (ref 65–99)
Glucose-Capillary: 109 mg/dL — ABNORMAL HIGH (ref 65–99)
Glucose-Capillary: 147 mg/dL — ABNORMAL HIGH (ref 65–99)
Glucose-Capillary: 93 mg/dL (ref 65–99)

## 2015-01-14 LAB — HEPARIN LEVEL (UNFRACTIONATED): HEPARIN UNFRACTIONATED: 0.29 [IU]/mL — AB (ref 0.30–0.70)

## 2015-01-14 MED ORDER — HEPARIN (PORCINE) IN NACL 100-0.45 UNIT/ML-% IJ SOLN
1500.0000 [IU]/h | INTRAMUSCULAR | Status: DC
  Administered 2015-01-14: 1300 [IU]/h via INTRAVENOUS
  Administered 2015-01-15: 1500 [IU]/h via INTRAVENOUS
  Filled 2015-01-14 (×4): qty 250

## 2015-01-14 MED ORDER — SODIUM CHLORIDE 0.9 % IJ SOLN
3.0000 mL | Freq: Two times a day (BID) | INTRAMUSCULAR | Status: DC
  Administered 2015-01-14: 3 mL via INTRAVENOUS

## 2015-01-14 MED ORDER — SODIUM CHLORIDE 0.9 % WEIGHT BASED INFUSION
3.0000 mL/kg/h | INTRAVENOUS | Status: DC
  Administered 2015-01-15: 3 mL/kg/h via INTRAVENOUS

## 2015-01-14 MED ORDER — SODIUM CHLORIDE 0.9 % IJ SOLN: 3.0000 mL | INTRAMUSCULAR | Status: DC | PRN

## 2015-01-14 MED ORDER — HEPARIN BOLUS VIA INFUSION
4000.0000 [IU] | Freq: Once | INTRAVENOUS | Status: AC
  Administered 2015-01-14: 4000 [IU] via INTRAVENOUS
  Filled 2015-01-14: qty 4000

## 2015-01-14 MED ORDER — HEPARIN BOLUS VIA INFUSION
1400.0000 [IU] | Freq: Once | INTRAVENOUS | Status: AC
  Administered 2015-01-14: 1400 [IU] via INTRAVENOUS
  Filled 2015-01-14: qty 1400

## 2015-01-14 MED ORDER — SODIUM CHLORIDE 0.9 % WEIGHT BASED INFUSION
1.0000 mL/kg/h | INTRAVENOUS | Status: DC
  Administered 2015-01-15: 1 mL/kg/h via INTRAVENOUS

## 2015-01-14 MED ORDER — METOPROLOL TARTRATE 25 MG PO TABS
25.0000 mg | ORAL_TABLET | Freq: Two times a day (BID) | ORAL | Status: DC
  Administered 2015-01-14: 25 mg via ORAL
  Filled 2015-01-14: qty 1

## 2015-01-14 MED ORDER — SODIUM CHLORIDE 0.9 % IV SOLN: 250.0000 mL | INTRAVENOUS | Status: DC | PRN

## 2015-01-14 NOTE — Consult Note (Addendum)
[facility] CLINIC CARDIOLOGY [facility]  CARDIOLOGY CONSULT NOTE  Patient ID: Jason Harrison MRN: [id number] DOB/AGE: [birth year] 64 y.o.  Admit date: 01/13/2015 Referring Physician Dr. Marcello Fennel Primary Physician Dr. Marcello Fennel Primary Cardiologist Dr. Lady Gary Reason for Consultation Chest pain/abnormal troponin  HPI: She is a 64 year old male with no prior cardiac disease but history of diabetes mellitus who was admitted after presenting to an acute care appointment with complaints of generalized fatigue malaise and myalgias. He had no chest pain. Electrocardiogram was done which showed mild less than 1 mm diffuse ST elevation throughout his precordium. He had a serum troponin of 5. He was sent to the emergency room because of this. Subsequent troponin was reduced in the mid 4 range. He continues to have nonspecific ST elevation throughout his precordium. He is being evaluated for possible [facility] spotted fever. He denies chest pain. He is very active without chest pain or shortness of breath. Etiology of his symptoms as pericarditis or atypical presentation of ischemia. We'll need an echocardiogram to evaluate for wall motion abnormality and/or for evidence of pericardial disease to guide further therapy  ROS Review of Systems - General ROS: positive for  - fatigue and malaise Respiratory ROS: no cough, shortness of breath, or wheezing Cardiovascular ROS: no chest pain or dyspnea on exertion Gastrointestinal ROS: no abdominal pain, change in bowel habits, or black or bloody stools Musculoskeletal ROS: positive for - joint pain and muscle pain Neurological ROS: no TIA or stroke symptoms   Past Medical History  Diagnosis Date  . Diabetes mellitus without complication   . Hypertension     History reviewed. No pertinent family history.  History   Social History  . Marital Status: Married    Spouse Name: N/A  . Number of Children: N/A  . Years of Education: N/A   Occupational  History  . Not on file.   Social History Main Topics  . Smoking status: Never Smoker   . Smokeless tobacco: Not on file  . Alcohol Use: No  . Drug Use: Not on file  . Sexual Activity: Not on file   Other Topics Concern  . Not on file   Social History Narrative  . No narrative on file    Past Surgical History  Procedure Laterality Date  . Eye surgery       Prescriptions prior to admission  Medication Sig Dispense Refill Last Dose  . aspirin EC 81 MG tablet Take 1 tablet by mouth daily.   01/13/2015 at Unknown time  . atorvastatin (LIPITOR) 20 MG tablet Take 1 tablet by mouth at bedtime.   01/12/2015 at Unknown time  . brimonidine (ALPHAGAN) 0.2 % ophthalmic solution Place 1 drop into both eyes 2 (two) times daily.   01/13/2015 at Unknown time  . doxycycline (VIBRA-TABS) 100 MG tablet Take 1 tablet by mouth 2 (two) times daily.   01/13/2015 at Unknown time  . glimepiride (AMARYL) 4 MG tablet Take 0.5 tablets by mouth daily.   01/13/2015 at Unknown time  . INVOKANA 300 MG TABS tablet Take 1 tablet by mouth daily.   01/13/2015 at Unknown time  . metFORMIN (GLUCOPHAGE) 1000 MG tablet Take 1 tablet by mouth 2 (two) times daily.   01/13/2015 at Unknown time  . Multiple Vitamin (MULTIVITAMIN WITH MINERALS) TABS tablet Take 1 tablet by mouth daily.   01/13/2015 at Unknown time  . pioglitazone (ACTOS) 30 MG tablet Take 1 tablet by mouth daily.   01/13/2015 at Unknown time  .  valsartan-hydrochlorothiazide (DIOVAN-HCT) 160-12.5 MG per tablet Take 1 tablet by mouth daily.   01/13/2015 at Unknown time  . VICTOZA 18 MG/3ML SOPN Inject 1.8 mg into the skin at bedtime.    01/12/2015 at Unknown time    Physical Exam: Blood pressure 115/60, pulse 102, temperature 97.8 F (36.6 C), temperature source Oral, resp. rate 20, height 5\' 10"  (1.778 m), weight 101.696 kg (224 lb 3.2 oz), SpO2 94 %. .   General appearance: alert and cooperative Head: Normocephalic, without obvious abnormality, atraumatic Resp:  clear to auscultation bilaterally Chest wall: no tenderness Cardio: regular rate and rhythm, S1, S2 normal, no murmur, click, rub or gallop GI: soft, non-tender; bowel sounds normal; no masses,  no organomegaly Extremities: extremities normal, atraumatic, no cyanosis or edema Pulses: 2+ and symmetric Neurologic: Grossly normal Labs:   Lab Results  Component Value Date   WBC 7.6 01/13/2015   HGB 13.9 01/13/2015   HCT 41.5 01/13/2015   MCV 88.3 01/13/2015   PLT 263 01/13/2015    Recent Labs Lab 01/13/15 1640  NA 136  K 4.3  CL 103  CO2 25  BUN 33*  CREATININE 1.45*  CALCIUM 9.2  PROT 7.8  BILITOT 0.8  ALKPHOS 56  ALT 25  AST 34  GLUCOSE 193*   Lab Results  Component Value Date   CKTOTAL 139 01/13/2015   CKMB 7.8* 01/13/2015   TROPONINI 4.66* 01/13/2015      Radiology: No evidence of pulmonary edema or acute process EKG: Sinus rhythm with diffuse ST elevation and somewhat delayed R-wave progression across the precordium  ASSESSMENT AND PLAN: She will with myalgias presenting with elevated serum troponin and ST changes in his electrocardiogram. These appear to be fairly diffuse. He does have delayed R-wave progression across precordium and what appears to be evolving  anteroseptal T waves.  Some of this may be atypical presentation of ischemia given his diabetes and possible silent I'll cardio infarction versus pericarditis given his myalgias. We'll continue to rule out continue with current regimen and proceed with an echocardiogram. Should there be any significant wall motion ML days, consideration for left heart catheter could be raised. We'll need to proceed with left cardiac catheterization evaluate for coronary artery disease. We'll start heparin at full dose and proceed with aspirin, beta blocker and ACE inhibitor as tolerated. He will need atorvastatin at 40-80 mg daily.  Signed: Dalia Heading MD, [facility] 01/14/2015, 10:27 AM

## 2015-01-14 NOTE — Progress Notes (Signed)
ANTICOAGULATION CONSULT NOTE - Initial Consult  Pharmacy Consult for Heparin Dosing Indication: chest pain/ACS  Allergies  Allergen Reactions  . Trulicity [Dulaglutide]     Patient Measurements: Height: 5\' 10"  (177.8 cm) Weight: 224 lb 3.2 oz (101.696 kg) IBW/kg (Calculated) : 73 Heparin Dosing Weight: 94kg  Vital Signs: Temp: 96.6 F (35.9 C) (07/13 1118) Temp Source: Axillary (07/13 1118) BP: 103/82 mmHg (07/13 1118) Pulse Rate: 93 (07/13 1118)  Labs:  Recent Labs  01/13/15 1107 01/13/15 1640  HGB  --  13.9  HCT  --  41.5  PLT  --  263  APTT  --  29  LABPROT  --  13.1  INR  --  0.97  CREATININE  --  1.45*  CKTOTAL  --  139  CKMB 7.8*  --   TROPONINI 5.41* 4.66*    Estimated Creatinine Clearance: 62.3 mL/min (by C-G formula based on Cr of 1.45).   Medical History: Past Medical History  Diagnosis Date  . Diabetes mellitus without complication   . Hypertension     Medications:  Scheduled:  . aspirin EC  81 mg Oral Daily  . atorvastatin  20 mg Oral QHS  . brimonidine  1 drop Both Eyes BID  . canagliflozin  300 mg Oral Daily  . doxycycline  100 mg Oral BID  . glimepiride  2 mg Oral Daily  . valsartan  160 mg Oral Daily   And  . hydrochlorothiazide  12.5 mg Oral Daily  . insulin aspart  0-15 Units Subcutaneous TID WC  . Liraglutide  1.8 mg Subcutaneous QHS  . metFORMIN  1,000 mg Oral BID  . multivitamin with minerals  1 tablet Oral Daily  . pioglitazone  30 mg Oral Daily  . sodium chloride  3 mL Intravenous Q12H   Infusions:  . heparin      Assessment: Pharmacy consulted for heparin drip management for 64 yo male being treated for possible NSTEMI. Patient received enoxaparin 40mg  SQ x 1 on 7/12 at 2200.    Goal of Therapy:  Heparin level 0.3-0.7 units/ml Monitor platelets by anticoagulation protocol: Yes   Plan:  Will bolus 4,000 units x 1  Start heparin infusion at 1300 units/hr Check anti-Xa level in 6 hours and daily while on  heparin Continue to monitor H&H and platelets  Kaylin Marcon L 01/14/2015,11:57 AM

## 2015-01-14 NOTE — Progress Notes (Signed)
*  PRELIMINARY RESULTS* Echocardiogram 2D Echocardiogram has been performed.  Georgann Housekeeper Hege 01/14/2015, 11:06 AM

## 2015-01-14 NOTE — Progress Notes (Signed)
ANTICOAGULATION CONSULT NOTE - Follow Up Consult  Pharmacy Consult for Heparin Indication: chest pain/ACS  Allergies  Allergen Reactions  . Trulicity [Dulaglutide]     Patient Measurements: Height: 5\' 10"  (177.8 cm) Weight: 224 lb 3.2 oz (101.696 kg) IBW/kg (Calculated) : 73 Heparin Dosing Weight: 94.6 kg  Vital Signs: Temp: 98.1 F (36.7 C) (07/13 1922) Temp Source: Oral (07/13 1922) BP: 115/73 mmHg (07/13 1922) Pulse Rate: 101 (07/13 1922)  Labs:  Recent Labs  01/13/15 1107 01/13/15 1640 01/14/15 1811  HGB  --  13.9  --   HCT  --  41.5  --   PLT  --  263  --   APTT  --  29  --   LABPROT  --  13.1  --   INR  --  0.97  --   HEPARINUNFRC  --   --  0.29*  CREATININE  --  1.45*  --   CKTOTAL  --  139  --   CKMB 7.8*  --   --   TROPONINI 5.41* 4.66*  --     Estimated Creatinine Clearance: 62.3 mL/min (by C-G formula based on Cr of 1.45).   Medications:  Infusions:  . [START ON 01/15/2015] sodium chloride     Followed by  . [START ON 01/15/2015] sodium chloride    . heparin 1,300 Units/hr (01/14/15 1232)    Assessment: Patient currently on Heparin drip at 1300 units/hr. Heparin level resulted at 0.29  Goal of Therapy:  Heparin level 0.3-0.7 units/ml Monitor platelets by anticoagulation protocol: Yes   Plan:  Will order Heparin bolus of 1400 units and increase drip rate to 1500 units/hr. Will check anti-Xa level in 6hr.  Clovia Cuff, PharmD, BCPS 01/14/2015 8:08 PM

## 2015-01-14 NOTE — Progress Notes (Signed)
PROGRESS NOTE  Jason Harrison WUJ:[id number] DOB: Jun 05, 1951 DOA: 01/13/2015 PCP: Barbette Reichmann, MD  HPI/Recap of past 24 hours: 64 y/o male with hx of Type 2 DM,HTN, Hyperlipidemia admitted with bilat arm pain, fever . EKG was suggestive of pericarditis. Troponin was 5.41. This am feels better    Consultants:  Dr. Lady Gary: Cardiologist   Objective: BP 93/62 mmHg  Pulse 95  Temp(Src) 98.9 F (37.2 C) (Oral)  Resp 20  Ht  (1.778 m)  Wt 101.696 kg (224 lb 3.2 oz)  BMI 32.17 kg/m2  SpO2 95%  Intake/Output Summary (Last 24 hours) at 01/14/15 9562 Last data filed at 01/14/15 0803  Gross per 24 hour  Intake      0 ml  Output   1250 ml  Net  -1250 ml   Filed Weights   01/13/15 1550 01/13/15 2000 01/14/15 0400  Weight: 102.513 kg (226 lb) 102.195 kg (225 lb 4.8 oz) 101.696 kg (224 lb 3.2 oz)    Exam:   General:  Not in distress  Cardiovascular: S1 S2 No friction rub  Respiratory: Clear to auscultation  Abdomen: Soft. Non tender.  Neuro:Non Focal  Data Reviewed: Basic Metabolic Panel:  Recent Labs Lab 01/13/15 1640  NA 136  K 4.3  CL 103  CO2 25  GLUCOSE 193*  BUN 33*  CREATININE 1.45*  CALCIUM 9.2   Liver Function Tests:  Recent Labs Lab 01/13/15 1640  AST 34  ALT 25  ALKPHOS 56  BILITOT 0.8  PROT 7.8  ALBUMIN 4.0   No results for input(s): LIPASE, AMYLASE in the last 168 hours. No results for input(s): AMMONIA in the last 168 hours. CBC:  Recent Labs Lab 01/13/15 1640  WBC 7.6  HGB 13.9  HCT 41.5  MCV 88.3  PLT 263   Cardiac Enzymes:    Recent Labs Lab 01/13/15 1107 01/13/15 1640  CKTOTAL  --  139  CKMB 7.8*  --   TROPONINI 5.41* 4.66*   BNP (last 3 results) No results for input(s): BNP in the last 8760 hours.  ProBNP (last 3 results) No results for input(s): PROBNP in the last 8760 hours.  CBG:  Recent Labs Lab 01/14/15 0720  GLUCAP 109*    No results found for this or any previous visit (from the  past 240 hour(s)).   Studies: Dg Chest 2 View  01/13/2015   CLINICAL DATA:  Abnormal EKG. Elevated cardiac enzymes. Diabetes and hypertension.  EXAM: CHEST  2 VIEW  COMPARISON:  11/06/2006  FINDINGS: Low lung volumes again noted, however both lungs are clear. No evidence of pleural effusion. Heart size is within normal limits. No definite masses or lymphadenopathy identified.  IMPRESSION: Low lung volumes.  No active disease.   Electronically Signed   By: Myles Rosenthal M.D.   On: 01/13/2015 16:37    Scheduled Meds: . aspirin EC  81 mg Oral Daily  . atorvastatin  20 mg Oral QHS  . brimonidine  1 drop Both Eyes BID  . canagliflozin  300 mg Oral Daily  . doxycycline  100 mg Oral BID  . enoxaparin (LOVENOX) injection  40 mg Subcutaneous Q24H  . glimepiride  2 mg Oral Daily  . valsartan  160 mg Oral Daily   And  . hydrochlorothiazide  12.5 mg Oral Daily  . insulin aspart  0-15 Units Subcutaneous TID WC  . Liraglutide   Intramuscular QHS  . metFORMIN  1,000 mg Oral BID  . multivitamin with minerals  1 tablet  Oral Daily  . pioglitazone  30 mg Oral Daily  . sodium chloride  3 mL Intravenous Q12H    Continuous Infusions:   Assessment/Plan: 1 Myalgia associated fever and diaphoresis and elevated troponin and abnormal EKG consistent with pericarditis ECHO today Continue Doxycycline 2 HTN: On HCTZ 3 Type 2 DM On Liraglutide, metformin and Invokana 4 Hyperlipidemia: On statins May be able to go home later today pending ECHO and cardiology input   Code Status: Full       Jason Harrison   01/14/2015, 8:22 AM  LOS: 1 day

## 2015-01-15 ENCOUNTER — Encounter
Admission: EM | Disposition: A | Discharge status: Home / Self Care | Payer: Self-pay | Source: Home / Self Care | Attending: Internal Medicine

## 2015-01-15 DIAGNOSIS — I2510 Atherosclerotic heart disease of native coronary artery without angina pectoris: Secondary | ICD-10-CM

## 2015-01-15 HISTORY — PX: CARDIAC CATHETERIZATION: SHX172

## 2015-01-15 LAB — GLUCOSE, CAPILLARY
GLUCOSE-CAPILLARY: 138 mg/dL — AB (ref 65–99)
Glucose-Capillary: 92 mg/dL (ref 65–99)
Glucose-Capillary: 89 mg/dL (ref 65–99)
Glucose-Capillary: 155 mg/dL — ABNORMAL HIGH (ref 65–99)

## 2015-01-15 LAB — CBC
HCT: 38.7 % — ABNORMAL LOW (ref 40.0–52.0)
HEMOGLOBIN: 12.7 g/dL — AB (ref 13.0–18.0)
MCH: 29 pg (ref 26.0–34.0)
MCHC: 32.9 g/dL (ref 32.0–36.0)
MCV: 88.4 fL (ref 80.0–100.0)
Platelets: 257 10*3/uL (ref 150–440)
RBC: 4.38 MIL/uL — AB (ref 4.40–5.90)
RDW: 13.2 % (ref 11.5–14.5)
WBC: 6.4 10*3/uL (ref 3.8–10.6)

## 2015-01-15 LAB — ROCKY MTN SPOTTED FVR ABS PNL(IGG+IGM)
RMSF IGG: NEGATIVE
RMSF IgM: 0.26 index (ref 0.00–0.89)

## 2015-01-15 LAB — HEPARIN LEVEL (UNFRACTIONATED)
Heparin Unfractionated: 0.38 IU/mL (ref 0.30–0.70)
Heparin Unfractionated: 0.3 IU/mL (ref 0.30–0.70)

## 2015-01-15 LAB — B. BURGDORFI ANTIBODIES: B burgdorferi Ab IgG+IgM: <0.91 {ISR} (ref 0.00–0.90)

## 2015-01-15 SURGERY — LEFT HEART CATH AND CORONARY ANGIOGRAPHY
Anesthesia: Moderate Sedation

## 2015-01-15 MED ORDER — MIDAZOLAM HCL 2 MG/2ML IJ SOLN
INTRAMUSCULAR | Status: AC
  Filled 2015-01-15: qty 2

## 2015-01-15 MED ORDER — BIVALIRUDIN BOLUS VIA INFUSION - CUPID
INTRAVENOUS | Status: DC | PRN
  Administered 2015-01-15: 75.9 mg via INTRAVENOUS

## 2015-01-15 MED ORDER — BIVALIRUDIN 250 MG IV SOLR
INTRAVENOUS | Status: AC
  Filled 2015-01-15: qty 250

## 2015-01-15 MED ORDER — NITROGLYCERIN 1 MG/10 ML FOR IR/CATH LAB
INTRA_ARTERIAL | Status: DC | PRN
  Administered 2015-01-15 (×2): 100 ug via INTRACORONARY

## 2015-01-15 MED ORDER — ATORVASTATIN CALCIUM 20 MG PO TABS
40.0000 mg | ORAL_TABLET | Freq: Every day | ORAL | Status: DC
  Administered 2015-01-15: 40 mg via ORAL
  Filled 2015-01-15: qty 2
  Filled 2015-01-15: qty 1

## 2015-01-15 MED ORDER — MIDAZOLAM HCL 2 MG/2ML IJ SOLN
INTRAMUSCULAR | Status: DC | PRN
  Administered 2015-01-15: 1 mg via INTRAVENOUS

## 2015-01-15 MED ORDER — TICAGRELOR 90 MG PO TABS
ORAL_TABLET | ORAL | Status: DC | PRN
  Administered 2015-01-15: 180 mg via ORAL

## 2015-01-15 MED ORDER — SODIUM CHLORIDE 0.9 % IV SOLN: 250.0000 mL | INTRAVENOUS | Status: DC | PRN

## 2015-01-15 MED ORDER — TICAGRELOR 90 MG PO TABS
90.0000 mg | ORAL_TABLET | Freq: Two times a day (BID) | ORAL | Status: DC
  Administered 2015-01-15 – 2015-01-16 (×2): 90 mg via ORAL
  Filled 2015-01-15 (×2): qty 1

## 2015-01-15 MED ORDER — FENTANYL CITRATE (PF) 100 MCG/2ML IJ SOLN
INTRAMUSCULAR | Status: DC | PRN
  Administered 2015-01-15: 25 ug via INTRAVENOUS

## 2015-01-15 MED ORDER — IOHEXOL 300 MG/ML  SOLN
INTRAMUSCULAR | Status: DC | PRN
  Administered 2015-01-15: 100 mL via INTRA_ARTERIAL
  Administered 2015-01-15: 130 mL via INTRA_ARTERIAL
  Administered 2015-01-15: 30 mL via INTRA_ARTERIAL

## 2015-01-15 MED ORDER — NITROGLYCERIN 5 MG/ML IV SOLN
INTRAVENOUS | Status: AC
  Filled 2015-01-15: qty 10

## 2015-01-15 MED ORDER — ASPIRIN 81 MG PO CHEW
81.0000 mg | CHEWABLE_TABLET | Freq: Every day | ORAL | Status: DC
  Administered 2015-01-16: 81 mg via ORAL
  Filled 2015-01-15: qty 1

## 2015-01-15 MED ORDER — HEPARIN (PORCINE) IN NACL 2-0.9 UNIT/ML-% IJ SOLN
INTRAMUSCULAR | Status: AC
  Filled 2015-01-15: qty 1000

## 2015-01-15 MED ORDER — SODIUM CHLORIDE 0.9 % IV SOLN
INTRAVENOUS | Status: DC
  Administered 2015-01-15: 15:00:00 via INTRAVENOUS

## 2015-01-15 MED ORDER — LIDOCAINE HCL (PF) 1 % IJ SOLN
INTRAMUSCULAR | Status: DC | PRN
  Administered 2015-01-15: 9 mL via INTRADERMAL

## 2015-01-15 MED ORDER — TICAGRELOR 90 MG PO TABS
ORAL_TABLET | ORAL | Status: AC
  Filled 2015-01-15: qty 2

## 2015-01-15 MED ORDER — BIVALIRUDIN 250 MG IV SOLR
250.0000 mg | INTRAVENOUS | Status: DC | PRN
  Administered 2015-01-15: 1.75 mg/kg/h via INTRAVENOUS

## 2015-01-15 MED ORDER — SODIUM CHLORIDE 0.9 % IJ SOLN: 3.0000 mL | INTRAMUSCULAR | Status: DC | PRN

## 2015-01-15 MED ORDER — ASPIRIN 81 MG PO CHEW
CHEWABLE_TABLET | ORAL | Status: DC | PRN
  Administered 2015-01-15: 324 mg via ORAL

## 2015-01-15 MED ORDER — ASPIRIN 81 MG PO CHEW
CHEWABLE_TABLET | ORAL | Status: AC
  Filled 2015-01-15: qty 4

## 2015-01-15 MED ORDER — FENTANYL CITRATE (PF) 100 MCG/2ML IJ SOLN
INTRAMUSCULAR | Status: DC | PRN
  Administered 2015-01-15: 50 ug via INTRAVENOUS
  Administered 2015-01-15: 25 ug via INTRAVENOUS

## 2015-01-15 MED ORDER — SODIUM CHLORIDE 0.9 % IJ SOLN
3.0000 mL | Freq: Two times a day (BID) | INTRAMUSCULAR | Status: DC
  Administered 2015-01-15 – 2015-01-16 (×2): 3 mL via INTRAVENOUS

## 2015-01-15 MED ORDER — FENTANYL CITRATE (PF) 100 MCG/2ML IJ SOLN
INTRAMUSCULAR | Status: AC
  Filled 2015-01-15: qty 2

## 2015-01-15 SURGICAL SUPPLY — 19 items
BALLN TREK RX 2.5X12 (BALLOONS) ×3
BALLN ~~LOC~~ TREK RX 3.0X12 (BALLOONS) ×3
BALLOON TREK RX 2.5X12 (BALLOONS) ×1 IMPLANT
BALLOON ~~LOC~~ TREK RX 3.0X12 (BALLOONS) ×1 IMPLANT
CATH INFINITI 5FR ANG PIGTAIL (CATHETERS) ×3 IMPLANT
CATH INFINITI 5FR JL4 (CATHETERS) ×3 IMPLANT
CATH INFINITI JR4 5F (CATHETERS) ×3 IMPLANT
CATH VISTA GUIDE 6FR XBLAD4 (CATHETERS) ×3 IMPLANT
DEVICE CLOSURE MYNXGRIP 6/7F (Vascular Products) ×3 IMPLANT
DEVICE INFLAT 30 PLUS (MISCELLANEOUS) ×3 IMPLANT
KIT MANI 3VAL PERCEP (MISCELLANEOUS) ×3 IMPLANT
NEEDLE PERC 18GX7CM (NEEDLE) ×3 IMPLANT
PACK CARDIAC CATH (CUSTOM PROCEDURE TRAY) ×3 IMPLANT
SHEATH AVANTI 5FR X 11CM (SHEATH) ×3 IMPLANT
SHEATH AVANTI 6FR X 11CM (SHEATH) ×3 IMPLANT
STENT XIENCE ALPINE RX 2.75X18 (Permanent Stent) ×3 IMPLANT
VALVE COPILOT STAT (MISCELLANEOUS) ×3 IMPLANT
WIRE EMERALD 3MM-J .035X150CM (WIRE) ×3 IMPLANT
WIRE INTUITION PROPEL ST 180CM (WIRE) ×3 IMPLANT

## 2015-01-15 NOTE — Progress Notes (Signed)
PROGRESS NOTE  Jason Harrison ZOX:[id number] DOB: 01-01-1951 DOA: 01/13/2015 PCP: Barbette Reichmann, MD  HPI/Recap of past 24 hours: 64 y/o male with hx of Type 2 DM,HTN, Hyperlipidemia admitted with bilat arm pain, fever . Troponin was 5.41. This am chest pain free Cardiac cath:99 % Mid Lad lesion- Underwent PCI    Consultants:  Dr. Lady Gary: Cardiologist   Objective: BP 120/72 mmHg  Pulse 101  Temp(Src) 98.1 F (36.7 C) (Oral)  Resp 18  Ht  (1.778 m)  Wt 101.152 kg (223 lb)  BMI 32.00 kg/m2  SpO2 97%  Intake/Output Summary (Last 24 hours) at 01/15/15 1442 Last data filed at 01/15/15 0154  Gross per 24 hour  Intake    183 ml  Output    400 ml  Net   -217 ml   Filed Weights   01/14/15 0400 01/15/15 0700 01/15/15 0911  Weight: 101.696 kg (224 lb 3.2 oz) 101.424 kg (223 lb 9.6 oz) 101.152 kg (223 lb)    Exam:   General:  Not in distress  Cardiovascular: S1 S2 No friction rub  Respiratory: Clear to auscultation  Abdomen: Soft. Non tender.  Neuro:Non Focal  Data Reviewed: Basic Metabolic Panel:  Recent Labs Lab 01/13/15 1640  NA 136  K 4.3  CL 103  CO2 25  GLUCOSE 193*  BUN 33*  CREATININE 1.45*  CALCIUM 9.2   Liver Function Tests:  Recent Labs Lab 01/13/15 1640  AST 34  ALT 25  ALKPHOS 56  BILITOT 0.8  PROT 7.8  ALBUMIN 4.0   No results for input(s): LIPASE, AMYLASE in the last 168 hours. No results for input(s): AMMONIA in the last 168 hours. CBC:  Recent Labs Lab 01/13/15 1640 01/15/15 0749  WBC 7.6 6.4  HGB 13.9 12.7*  HCT 41.5 38.7*  MCV 88.3 88.4  PLT 263 257   Cardiac Enzymes:    Recent Labs Lab 01/13/15 1107 01/13/15 1640  CKTOTAL  --  139  CKMB 7.8*  --   TROPONINI 5.41* 4.66*   BNP (last 3 results) No results for input(s): BNP in the last 8760 hours.  ProBNP (last 3 results) No results for input(s): PROBNP in the last 8760 hours.  CBG:  Recent Labs Lab 01/14/15 1120 01/14/15 1607 01/14/15 2039  01/15/15 0730 01/15/15 1333  GLUCAP 147* 110* 93 92 89    Recent Results (from the past 240 hour(s))  Blood culture (routine x 2)     Status: None (Preliminary result)   Collection Time: 01/13/15  4:41 PM  Result Value Ref Range Status   Specimen Description BLOOD RIGHT ANTECUBITAL  Final   Special Requests BOTTLES DRAWN AEROBIC AND ANAEROBIC  Final   Culture NO GROWTH 2 DAYS  Final   Report Status PENDING  Incomplete  Blood culture (routine x 2)     Status: None (Preliminary result)   Collection Time: 01/13/15  4:41 PM  Result Value Ref Range Status   Specimen Description BLOOD L HAND IV  Final   Special Requests BOTTLES DRAWN AEROBIC AND ANAEROBIC  Final   Culture NO GROWTH 2 DAYS  Final   Report Status PENDING  Incomplete     Studies: No results found.  Scheduled Meds: . aspirin  81 mg Oral Daily  . atorvastatin  40 mg Oral QHS  . brimonidine  1 drop Both Eyes BID  . canagliflozin  300 mg Oral Daily  . doxycycline  100 mg Oral BID  . glimepiride  2 mg  Oral Daily  . insulin aspart  0-15 Units Subcutaneous TID WC  . Liraglutide  1.8 mg Subcutaneous QHS  . multivitamin with minerals  1 tablet Oral Daily  . sodium chloride  3 mL Intravenous Q12H  . sodium chloride  3 mL Intravenous Q12H  . ticagrelor  90 mg Oral BID  . valsartan  160 mg Oral Daily    Continuous Infusions: . sodium chloride 75 mL/hr at 01/15/15 1437    Assessment/Plan: 1 Acute NSTEMI- Cardiac cath 99% LAD lesion- underwent PCI stent- Doing well On Brilinta  D/c Doxycycline 2 HTN: On HCTZ 3 Type 2 DM On Liraglutide, Metformin and Invokana 4 Hyperlipidemia: On statins 5 Tentative d/c in am    Code Status: Full       Laterrica Libman   01/15/2015, 2:42 PM  LOS: 2 days

## 2015-01-15 NOTE — Progress Notes (Signed)
[facility]  SUBJECTIVE: Doing well post pci of lad with des   Filed Vitals:   01/15/15 0656 01/15/15 0700 01/15/15 0911 01/15/15 1059  BP: 114/74  131/80   Pulse: 98  102   Temp: 98.2 F (36.8 C)  98.1 F (36.7 C)   TempSrc: Oral  Oral   Resp: 20  20   Height:   5\' 10"  (1.778 m)   Weight:  101.424 kg (223 lb 9.6 oz) 101.152 kg (223 lb)   SpO2: 94%  95% 91%    Intake/Output Summary (Last 24 hours) at 01/15/15 1132 Last data filed at 01/15/15 0154  Gross per 24 hour  Intake    183 ml  Output    400 ml  Net   -217 ml    LABS: Basic Metabolic Panel:  Recent Labs  20/10/07 1640  NA 136  K 4.3  CL 103  CO2 25  GLUCOSE 193*  BUN 33*  CREATININE 1.45*  CALCIUM 9.2   Liver Function Tests:  Recent Labs  01/13/15 1640  AST 34  ALT 25  ALKPHOS 56  BILITOT 0.8  PROT 7.8  ALBUMIN 4.0   No results for input(s): LIPASE, AMYLASE in the last 72 hours. CBC:  Recent Labs  01/13/15 1640 01/15/15 0749  WBC 7.6 6.4  HGB 13.9 12.7*  HCT 41.5 38.7*  MCV 88.3 88.4  PLT 263 257   Cardiac Enzymes:  Recent Labs  01/13/15 1107 01/13/15 1640  CKTOTAL  --  139  CKMB 7.8*  --   TROPONINI 5.41* 4.66*   BNP: Invalid input(s): POCBNP D-Dimer: No results for input(s): DDIMER in the last 72 hours. Hemoglobin A1C: No results for input(s): HGBA1C in the last 72 hours. Fasting Lipid Panel: No results for input(s): CHOL, HDL, LDLCALC, TRIG, CHOLHDL, LDLDIRECT in the last 72 hours. Thyroid Function Tests: No results for input(s): TSH, T4TOTAL, T3FREE, THYROIDAB in the last 72 hours.  Invalid input(s): FREET3 Anemia Panel: No results for input(s): VITAMINB12, FOLATE, FERRITIN, TIBC, IRON, RETICCTPCT in the last 72 hours.   Physical Exam: Blood pressure 131/80, pulse 102, temperature 98.1 F (36.7 C), temperature source Oral, resp. rate 20, height 5\' 10"  (1.778 m), weight 101.152 kg (223 lb), SpO2 91 %.  General appearance:  alert and cooperative Head: Normocephalic, without obvious abnormality, atraumatic Resp: clear to auscultation bilaterally Cardio: regular rate and rhythm, S1, S2 normal, no murmur, click, rub or gallop GI: soft, non-tender; bowel sounds normal; no masses,  no organomegaly Extremities: extremities normal, atraumatic, no cyanosis or edema Pulses: 2+ and symmetric Neurologic: Grossly normal  TELEMETRY: Reviewed telemetry pt in sinus tachycardia  ASSESSMENT AND PLAN:  Principal Problem:   Elevated troponin-s/p nstemi with anteroapical akinesis lv. Underwent cardiac cath this am which revealed 95% MID LAD. Underwent pci of lad with des.  Placed on ticagrelor 90 bid. Continue with metoprolol, valsartan and discontinue hctz. Continue with atorvastatin. Follow overnight and plan d/c in am if stable.    Dalia Heading., MD, [facility] 01/15/2015 11:32 AM

## 2015-01-15 NOTE — Care Management (Signed)
Patient had PCI today and placed on Brilinta.  Patient confirms that he has insurance and medication coverage.  Provided patient with Education booklet with the coupon for free 30 day and copay assist

## 2015-01-15 NOTE — Progress Notes (Signed)
Alert and oriented. No complaints of pain. PCI complete today by Dr. Lady Gary and Kirke Corin. Site is clean dry and intact with no hematoma. Patient has ambulated to the bathroom with no difficulties. Vitals are stable. Will continue to monitor.

## 2015-01-16 ENCOUNTER — Encounter: Payer: Self-pay | Admitting: Cardiology

## 2015-01-16 LAB — BASIC METABOLIC PANEL
Anion gap: 11 (ref 5–15)
BUN: 19 mg/dL (ref 6–20)
CHLORIDE: 105 mmol/L (ref 101–111)
CO2: 22 mmol/L (ref 22–32)
CREATININE: 1.03 mg/dL (ref 0.61–1.24)
Calcium: 9.1 mg/dL (ref 8.9–10.3)
GFR calc Af Amer: >60 mL/min (ref >60)
GFR calc non Af Amer: >60 mL/min (ref >60)
Glucose, Bld: 114 mg/dL — ABNORMAL HIGH (ref 65–99)
Potassium: 4.1 mmol/L (ref 3.5–5.1)
SODIUM: 138 mmol/L (ref 135–145)

## 2015-01-16 LAB — CBC
HEMATOCRIT: 38.2 % — AB (ref 40.0–52.0)
Hemoglobin: 12.6 g/dL — ABNORMAL LOW (ref 13.0–18.0)
MCH: 28.8 pg (ref 26.0–34.0)
MCHC: 33 g/dL (ref 32.0–36.0)
MCV: 87.1 fL (ref 80.0–100.0)
PLATELETS: 273 10*3/uL (ref 150–440)
RBC: 4.38 MIL/uL — AB (ref 4.40–5.90)
RDW: 13.2 % (ref 11.5–14.5)
WBC: 7.2 10*3/uL (ref 3.8–10.6)

## 2015-01-16 LAB — GLUCOSE, CAPILLARY: Glucose-Capillary: 96 mg/dL (ref 65–99)

## 2015-01-16 MED ORDER — VALSARTAN 160 MG PO TABS: 160.0000 mg | ORAL_TABLET | Freq: Every day | ORAL | Status: DC

## 2015-01-16 MED ORDER — TICAGRELOR 90 MG PO TABS
90.0000 mg | ORAL_TABLET | Freq: Two times a day (BID) | ORAL | Status: DC
Start: 2015-01-16 — End: 2023-10-10

## 2015-01-16 NOTE — Progress Notes (Signed)
PROGRESS NOTE  Jason Harrison ONG:[id number] DOB: May 31, 1951 DOA: 01/13/2015 PCP: Jason Reichmann, MD  HPI/Recap of past 24 hours: 64 y/o male with hx of Type 2 DM,HTN, Hyperlipidemia admitted with bilat arm pain, fever . Troponin was 5.41. Cardiac cath:99 % Mid Lad lesion- Underwent PCI with stent yesterday This am feels well   Consultants:  Dr. Lady Harrison: Cardiologist   Objective: BP 108/76 mmHg  Pulse 96  Temp(Src) 98.3 F (36.8 C) (Oral)  Resp 18  Ht  (1.778 m)  Wt 101.152 kg (223 lb)  BMI 32.00 kg/m2  SpO2 95%  Intake/Output Summary (Last 24 hours) at 01/16/15 0816 Last data filed at 01/15/15 1845  Gross per 24 hour  Intake    950 ml  Output      0 ml  Net    950 ml   Filed Weights   01/14/15 0400 01/15/15 0700 01/15/15 0911  Weight: 101.696 kg (224 lb 3.2 oz) 101.424 kg (223 lb 9.6 oz) 101.152 kg (223 lb)    Exam:   General:  Not in distress  Cardiovascular: S1 S2 No friction rub  Respiratory: Clear to auscultation  Abdomen: Soft. Non tender.  Neuro:Non Focal  Data Reviewed: Basic Metabolic Panel:  Recent Labs Lab 01/13/15 1640 01/16/15 0408  NA 136 138  K 4.3 4.1  CL 103 105  CO2 25 22  GLUCOSE 193* 114*  BUN 33* 19  CREATININE 1.45* 1.03  CALCIUM 9.2 9.1   Liver Function Tests:  Recent Labs Lab 01/13/15 1640  AST 34  ALT 25  ALKPHOS 56  BILITOT 0.8  PROT 7.8  ALBUMIN 4.0   No results for input(s): LIPASE, AMYLASE in the last 168 hours. No results for input(s): AMMONIA in the last 168 hours. CBC:  Recent Labs Lab 01/13/15 1640 01/15/15 0749 01/16/15 0408  WBC 7.6 6.4 7.2  HGB 13.9 12.7* 12.6*  HCT 41.5 38.7* 38.2*  MCV 88.3 88.4 87.1  PLT 263 257 273   Cardiac Enzymes:    Recent Labs Lab 01/13/15 1107 01/13/15 1640  CKTOTAL  --  139  CKMB 7.8*  --   TROPONINI 5.41* 4.66*   BNP (last 3 results) No results for input(s): BNP in the last 8760 hours.  ProBNP (last 3 results) No results for input(s):  PROBNP in the last 8760 hours.  CBG:  Recent Labs Lab 01/15/15 0730 01/15/15 1333 01/15/15 1714 01/15/15 2012 01/16/15 0728  GLUCAP 92 89 155* 138* 96    Recent Results (from the past 240 hour(s))  Blood culture (routine x 2)     Status: None (Preliminary result)   Collection Time: 01/13/15  4:41 PM  Result Value Ref Range Status   Specimen Description BLOOD RIGHT ANTECUBITAL  Final   Special Requests BOTTLES DRAWN AEROBIC AND ANAEROBIC  Final   Culture NO GROWTH 3 DAYS  Final   Report Status PENDING  Incomplete  Blood culture (routine x 2)     Status: None (Preliminary result)   Collection Time: 01/13/15  4:41 PM  Result Value Ref Range Status   Specimen Description BLOOD L HAND IV  Final   Special Requests BOTTLES DRAWN AEROBIC AND ANAEROBIC  Final   Culture NO GROWTH 3 DAYS  Final   Report Status PENDING  Incomplete     Studies: No results found.  Scheduled Meds: . aspirin  81 mg Oral Daily  . atorvastatin  40 mg Oral QHS  . brimonidine  1 drop Both Eyes BID  .  canagliflozin  300 mg Oral Daily  . glimepiride  2 mg Oral Daily  . insulin aspart  0-15 Units Subcutaneous TID WC  . Liraglutide  1.8 mg Subcutaneous QHS  . multivitamin with minerals  1 tablet Oral Daily  . sodium chloride  3 mL Intravenous Q12H  . sodium chloride  3 mL Intravenous Q12H  . ticagrelor  90 mg Oral BID  . valsartan  160 mg Oral Daily    Continuous Infusions:    Assessment/Plan: 1 Acute NSTEMI- Cardiac cath 99% LAD lesion- underwent PCI stent- Doing well On Brilinta  2 HTN: On HCTZ 3 Type 2 DM On Liraglutide, Metformin and Invokana 4 Hyperlipidemia: On statins 5 D/c home today Follow up in 1-2 weeks    Code Status: Full       Jason Harrison   01/16/2015, 8:16 AM  LOS: 3 days

## 2015-01-16 NOTE — Progress Notes (Signed)
Patient: Jason Harrison / Admit Date: 01/13/2015 / Date of Encounter: 01/16/2015, 8:21 AM   Subjective: DOing well post pci. No evidence of renal insufficiency or cath site hematoma. Distal pulses intact  Review of Systems: ROS All other systems reviewed and negative.   Objective: Telemetry: nsr Physical Exam: Blood pressure 108/76, pulse 96, temperature 98.3 F (36.8 C), temperature source Oral, resp. rate 18, height  (1.778 m), weight 101.152 kg (223 lb), SpO2 95 %. Body mass index is 32 kg/(m^2). General: Well developed, well nourished, in no acute distress.No chest or arm pain Head: Normocephalic, atraumatic, sclera non-icteric, no xanthomas, nares are without discharge. Neck: Negative for carotid bruits. JVP not elevated. Lungs: Clear bilaterally to auscultation without wheezes, rales, or rhonchi. Breathing is unlabored. Heart: RRR S1 S2 without murmurs, rubs, or gallops.  Abdomen: Soft, non-tender, non-distended with normoactive bowel sounds. No rebound/guarding. Extremities: No clubbing or cyanosis. No edema. Distal pedal pulses are 2+ and equal bilaterally. Neuro: Alert and oriented X 3. Moves all extremities spontaneously. Psych:  Responds to questions appropriately with a normal affect.   Intake/Output Summary (Last 24 hours) at 01/16/15 0821 Last data filed at 01/15/15 1845  Gross per 24 hour  Intake    950 ml  Output      0 ml  Net    950 ml    Inpatient Medications:  . aspirin  81 mg Oral Daily  . atorvastatin  40 mg Oral QHS  . brimonidine  1 drop Both Eyes BID  . canagliflozin  300 mg Oral Daily  . glimepiride  2 mg Oral Daily  . insulin aspart  0-15 Units Subcutaneous TID WC  . Liraglutide  1.8 mg Subcutaneous QHS  . multivitamin with minerals  1 tablet Oral Daily  . sodium chloride  3 mL Intravenous Q12H  . sodium chloride  3 mL Intravenous Q12H  . ticagrelor  90 mg Oral BID  . valsartan  160 mg Oral Daily   Infusions:    Labs:  Recent  Labs  01/13/15 1640 01/16/15 0408  NA 136 138  K 4.3 4.1  CL 103 105  CO2 25 22  GLUCOSE 193* 114*  BUN 33* 19  CREATININE 1.45* 1.03  CALCIUM 9.2 9.1    Recent Labs  01/13/15 1640  AST 34  ALT 25  ALKPHOS 56  BILITOT 0.8  PROT 7.8  ALBUMIN 4.0    Recent Labs  01/15/15 0749 01/16/15 0408  WBC 6.4 7.2  HGB 12.7* 12.6*  HCT 38.7* 38.2*  MCV 88.4 87.1  PLT 257 273    Recent Labs  01/13/15 1107 01/13/15 1640  CKTOTAL  --  139  CKMB 7.8*  --   TROPONINI 5.41* 4.66*   Invalid input(s): POCBNP No results for input(s): HGBA1C in the last 72 hours.   Weights: Filed Weights   01/14/15 0400 01/15/15 0700 01/15/15 0911  Weight: 101.696 kg (224 lb 3.2 oz) 101.424 kg (223 lb 9.6 oz) 101.152 kg (223 lb)     Radiology/Studies:  Dg Chest 2 View  01/13/2015   CLINICAL DATA:  Abnormal EKG. Elevated cardiac enzymes. Diabetes and hypertension.  EXAM: CHEST  2 VIEW  COMPARISON:  11/06/2006  FINDINGS: Low lung volumes again noted, however both lungs are clear. No evidence of pleural effusion. Heart size is within normal limits. No definite masses or lymphadenopathy identified.  IMPRESSION: Low lung volumes.  No active disease.   Electronically Signed   By: Alver Sorrow.D.  On: 01/13/2015 16:37     Assessment and Plan  64 y.o. male post pci of lad with des.  OK for discharge on asa 81 mg , brilinta 90 bid; atorvastatin, metoprolol. Follow up in 1 week.   Signed, Darlin Priestly Jason Talavera MD [facility]  01/16/2015, 8:21 AM

## 2015-01-16 NOTE — Progress Notes (Signed)
IV and tele removed. Prescriptions given to pt. NSR. Room air. Takes meds ok. Vascular instructions given to pt. Groin site clear of bleeding and hematoma. Pt has not reported any pain. Discharge isntructions given to pt. A & O. Pt has no further concerns at this time.

## 2015-01-16 NOTE — Discharge Summary (Signed)
Physician Discharge Summary  Jason Harrison PIR:[id number] DOB: 10/21/1950 DOA: 01/14/2015  PCP: Barbette Reichmann, MD  Admit date: 01/14/2015 Discharge date: 01/16/2015  Time spent: 35 minutes  Recommendations for Outpatient Follow-up:  1. Follow up with Dr. Lady Gary 2.  Follow up with Dr. Marcello Fennel 3.  Follow up with Dr.Paul  Discharge Diagnoses:  1 Acute NSTEMI Cardiac cath showing  99 % Mid LAD occlusion- Underwent PCI and stent  2 Type 2 DM 3 HTN   Discharge Condition: Stable  Diet recommendation: 1800 cal  There were no vitals filed for this visit.  History of present illness:  Jason Harrison is a 64 year old male with a known history of Hypertension, Type 2 diabetes, Hyperlipidemia, Pt was initially seen at the [facility] with bilateral arm pain and possible tick bite fever. EKG had shown evidence for ST-T changes and Troponin was notably elevated at 5.41. He was subsequently sent to emergency room .   Hospital Course:   Patient was admitted [facility] Echocardiogram showed EF of 40-45% with akinesis of the apical myocardium and akinesis of  the anteroseptal myocardium A cardiac catheterization revealed mid LAD lesion 99% stenosis and 35% first marginal lesion there is mild-to-moderate LV systolic dysfunction. Patient underwent underwent successful angioplasty and drug-eluting stent placement to the mid LAD Patient was asymptomatic .His arm pain also resolved and was stable at time of discharge   .   Procedures:  Cardiac cath: 99 % Mid LAD lesion  Consultations:  Dr.Fath: Cardiologist   Discharge Exam: There were no vitals taken for this visit.  General: Not in distress Cardiovascular: s1 S2 Respiratory: Clear to auscultation  Discharge Instructions    Cannot display discharge medications since this is not an admission.  Allergies  Allergen Reactions  . Trulicity [Dulaglutide]       The results of significant diagnostics  from this hospitalization (including imaging, microbiology, ancillary and laboratory) are listed below for reference.    Significant Diagnostic Studies: Dg Chest 2 View  01/13/2015   CLINICAL DATA:  Abnormal EKG. Elevated cardiac enzymes. Diabetes and hypertension.  EXAM: CHEST  2 VIEW  COMPARISON:  11/06/2006  FINDINGS: Low lung volumes again noted, however both lungs are clear. No evidence of pleural effusion. Heart size is within normal limits. No definite masses or lymphadenopathy identified.  IMPRESSION: Low lung volumes.  No active disease.   Electronically Signed   By: Myles Rosenthal M.D.   On: 01/13/2015 16:37    Microbiology: Recent Results (from the past 240 hour(s))  Blood culture (routine x 2)     Status: None (Preliminary result)   Collection Time: 01/13/15  4:41 PM  Result Value Ref Range Status   Specimen Description BLOOD RIGHT ANTECUBITAL  Final   Special Requests BOTTLES DRAWN AEROBIC AND ANAEROBIC  Final   Culture NO GROWTH 3 DAYS  Final   Report Status PENDING  Incomplete  Blood culture (routine x 2)     Status: None (Preliminary result)   Collection Time: 01/13/15  4:41 PM  Result Value Ref Range Status   Specimen Description BLOOD L HAND IV  Final   Special Requests BOTTLES DRAWN AEROBIC AND ANAEROBIC  Final   Culture NO GROWTH 3 DAYS  Final   Report Status PENDING  Incomplete     Labs: Basic Metabolic Panel:  Recent Labs Lab 01/13/15 1640 01/16/15 0408  NA 136 138  K 4.3 4.1  CL 103 105  CO2 25 22  GLUCOSE 193* 114*  BUN 33* 19  CREATININE 1.45* 1.03  CALCIUM 9.2 9.1   Liver Function Tests:  Recent Labs Lab 01/13/15 1640  AST 34  ALT 25  ALKPHOS 56  BILITOT 0.8  PROT 7.8  ALBUMIN 4.0   No results for input(s): LIPASE, AMYLASE in the last 168 hours. No results for input(s): AMMONIA in the last 168 hours. CBC:  Recent Labs Lab 01/13/15 1640 01/15/15 0749 01/16/15 0408  WBC 7.6 6.4 7.2  HGB 13.9 12.7* 12.6*  HCT 41.5 38.7* 38.2*   MCV 88.3 88.4 87.1  PLT 263 257 273   Cardiac Enzymes:  Recent Labs Lab 01/13/15 1107 01/13/15 1640  CKTOTAL  --  139  CKMB 7.8*  --   TROPONINI 5.41* 4.66*   BNP: BNP (last 3 results) No results for input(s): BNP in the last 8760 hours.  ProBNP (last 3 results) No results for input(s): PROBNP in the last 8760 hours.  CBG:  Recent Labs Lab 01/15/15 0730 01/15/15 1333 01/15/15 1714 01/15/15 2012 01/16/15 0728  GLUCAP 92 89 155* 138* 96   Patient will follow-up with me Dr. Marcello Fennel  also follow up with Dr.Fath  Cardiologist and  Dr. Renae Fickle endocrinologist, He has been advised to call the office with any questions or concerns     Signed:  Luiza Carranco   01/16/2015, 12:46 PM

## 2015-01-16 NOTE — Care Management (Signed)
Just happened to find a prior auth request for patient's Brilinta.  Completed the prior auth referral on line.  Spoke with [facility] pharmacist and informed that patient was given 6 tablets.  Discussed that patient had a 30 day free coupon and informed that there was no record this had been presented.  Spoke with patient's wife and she stated that someone "scanned the card" and gave it back to her.

## 2015-01-18 LAB — CULTURE, BLOOD (ROUTINE X 2)
CULTURE: NO GROWTH
Culture: NO GROWTH

## 2015-01-27 NOTE — ED Notes (Signed)
Total critical care time spent 35 minutes.  Gayla Doss, MD 01/27/15 [phone]

## 2018-02-19 ENCOUNTER — Ambulatory Visit
Admission: RE | Admit: 2018-02-19 | Discharge: 2018-02-19 | Disposition: A | Discharge status: Home / Self Care | Payer: Medicare Other | Source: Ambulatory Visit | Attending: Chiropractic Medicine | Admitting: Chiropractic Medicine

## 2018-02-19 ENCOUNTER — Other Ambulatory Visit: Payer: Self-pay | Admitting: Chiropractic Medicine

## 2018-02-19 DIAGNOSIS — M546 Pain in thoracic spine: Secondary | ICD-10-CM | POA: Insufficient documentation

## 2018-02-19 DIAGNOSIS — M5414 Radiculopathy, thoracic region: Secondary | ICD-10-CM

## 2018-02-19 IMAGING — CR DG THORACIC SPINE 2V
1 series · 4 of 4 positions shown · non-contrast
Comparison: 01/13/2015 chest radiograph.

CLINICAL DATA: 67 y/o M; pain between the shoulder blades for 10
days.

EXAM:
THORACIC SPINE 2 VIEWS

[Series 1: dg thoracic spine 2 view · 0.14mm/px · 4 of 4 slices shown]
[im 1/4]
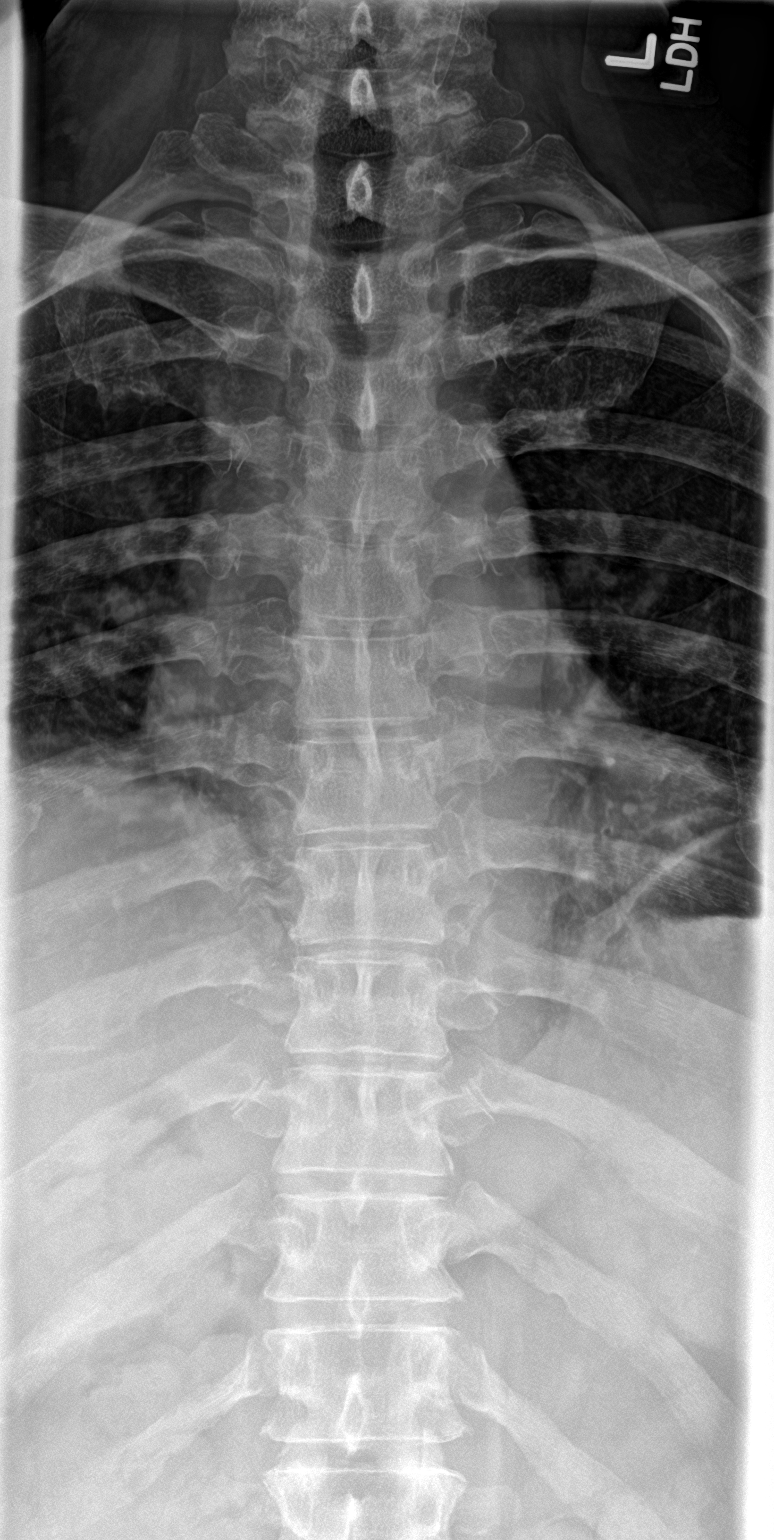
[im 2/4]
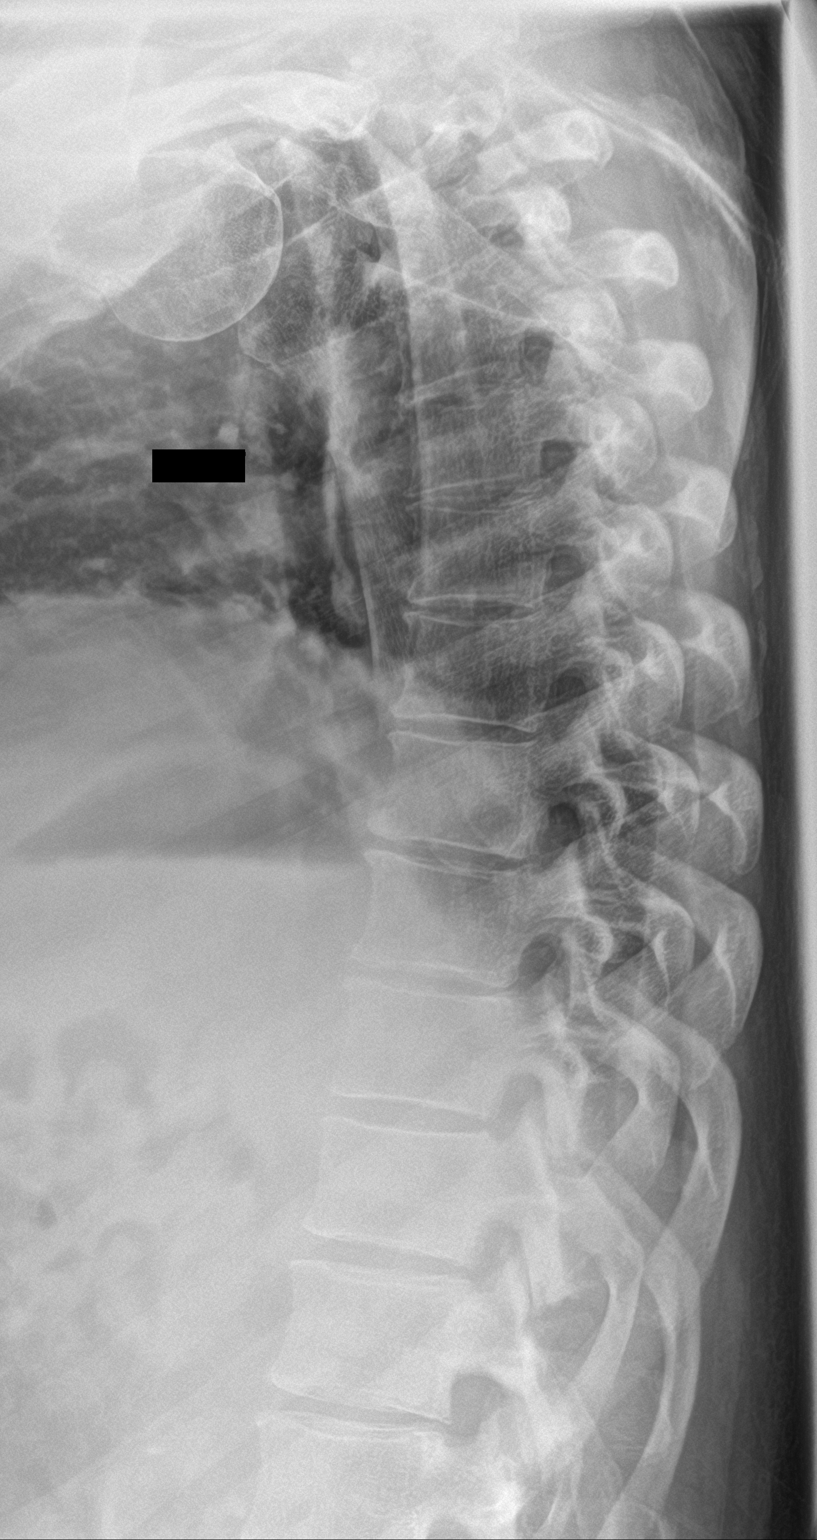
[im 3/4]
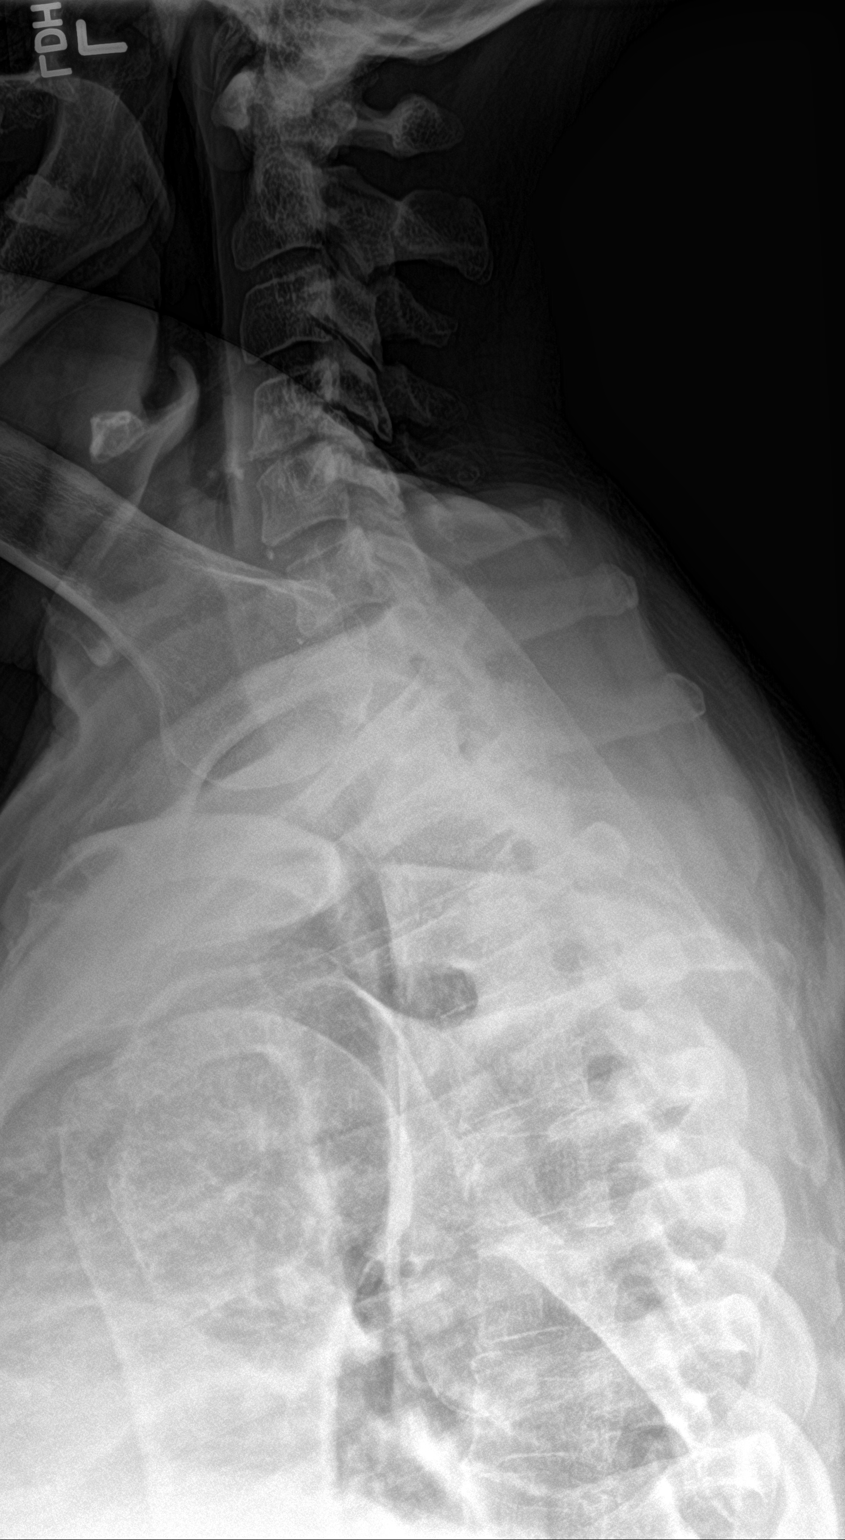
[im 4/4]
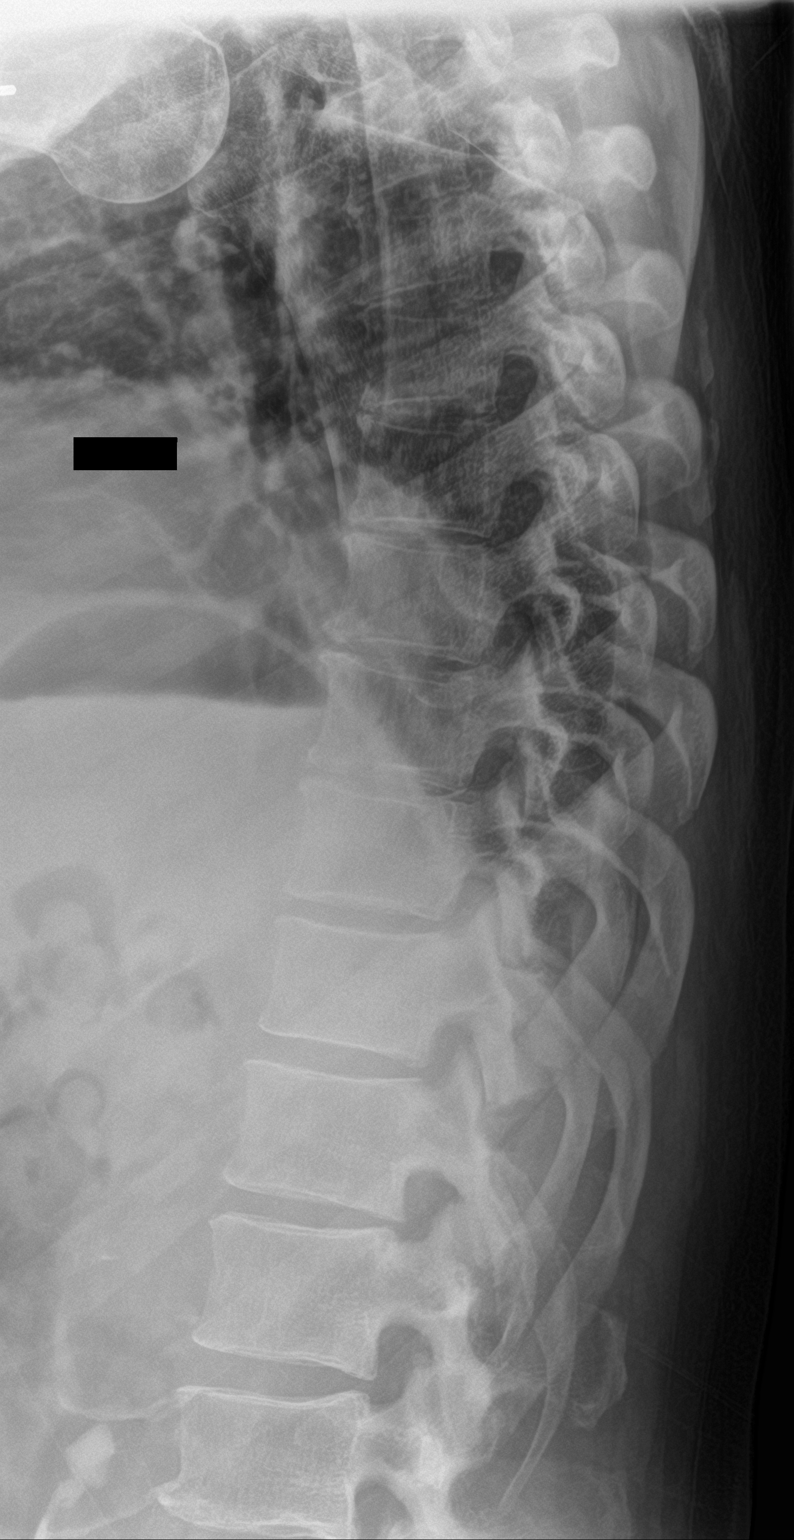

[4 of 4 positions shown; findings below may reference images not displayed]

FINDINGS: There is no evidence of thoracic spine fracture. Alignment is
normal. Minimal multilevel discogenic degenerative changes with
small anterior marginal osteophytes.
IMPRESSION: No acute fracture or malalignment. Minimal discogenic degenerative
changes of the thoracic spine.

By: Jo Are Mesbah M.D.

## 2018-04-17 ENCOUNTER — Ambulatory Visit: Payer: Medicare Other | Admitting: Anesthesiology

## 2018-04-17 ENCOUNTER — Ambulatory Visit
Admission: RE | Admit: 2018-04-17 | Discharge: 2018-04-17 | Disposition: A | Discharge status: Home / Self Care | Payer: Medicare Other | Source: Ambulatory Visit | Attending: Gastroenterology | Admitting: Gastroenterology

## 2018-04-17 ENCOUNTER — Encounter
Admission: RE | Disposition: A | Discharge status: Home / Self Care | Payer: Self-pay | Source: Ambulatory Visit | Attending: Gastroenterology

## 2018-04-17 ENCOUNTER — Encounter: Payer: Self-pay | Admitting: *Deleted

## 2018-04-17 DIAGNOSIS — K5730 Diverticulosis of large intestine without perforation or abscess without bleeding: Secondary | ICD-10-CM | POA: Diagnosis not present

## 2018-04-17 DIAGNOSIS — Z7984 Long term (current) use of oral hypoglycemic drugs: Secondary | ICD-10-CM | POA: Diagnosis not present

## 2018-04-17 DIAGNOSIS — I10 Essential (primary) hypertension: Secondary | ICD-10-CM | POA: Insufficient documentation

## 2018-04-17 DIAGNOSIS — E1121 Type 2 diabetes mellitus with diabetic nephropathy: Secondary | ICD-10-CM | POA: Insufficient documentation

## 2018-04-17 DIAGNOSIS — Z79899 Other long term (current) drug therapy: Secondary | ICD-10-CM | POA: Diagnosis not present

## 2018-04-17 DIAGNOSIS — E11319 Type 2 diabetes mellitus with unspecified diabetic retinopathy without macular edema: Secondary | ICD-10-CM | POA: Diagnosis not present

## 2018-04-17 DIAGNOSIS — K6389 Other specified diseases of intestine: Secondary | ICD-10-CM | POA: Diagnosis not present

## 2018-04-17 DIAGNOSIS — Z1211 Encounter for screening for malignant neoplasm of colon: Secondary | ICD-10-CM | POA: Insufficient documentation

## 2018-04-17 DIAGNOSIS — Z7982 Long term (current) use of aspirin: Secondary | ICD-10-CM | POA: Diagnosis not present

## 2018-04-17 HISTORY — DX: Type 2 diabetes mellitus with diabetic nephropathy: E11.21

## 2018-04-17 HISTORY — PX: COLONOSCOPY WITH PROPOFOL: SHX5780

## 2018-04-17 HISTORY — DX: Type 2 diabetes mellitus with unspecified diabetic retinopathy without macular edema: E11.319

## 2018-04-17 SURGERY — COLONOSCOPY WITH PROPOFOL
Anesthesia: General

## 2018-04-17 MED ORDER — SODIUM CHLORIDE 0.9 % IV SOLN
INTRAVENOUS | Status: DC
  Administered 2018-04-17: 1000 mL via INTRAVENOUS

## 2018-04-17 MED ORDER — PROPOFOL 500 MG/50ML IV EMUL
INTRAVENOUS | Status: AC
  Filled 2018-04-17: qty 50

## 2018-04-17 MED ORDER — PROPOFOL 10 MG/ML IV BOLUS
INTRAVENOUS | Status: AC
  Filled 2018-04-17: qty 20

## 2018-04-17 MED ORDER — EPHEDRINE SULFATE 50 MG/ML IJ SOLN
INTRAMUSCULAR | Status: AC
  Filled 2018-04-17: qty 1

## 2018-04-17 MED ORDER — SODIUM CHLORIDE 0.9 % IV SOLN: INTRAVENOUS | Status: DC

## 2018-04-17 MED ORDER — PROPOFOL 500 MG/50ML IV EMUL
INTRAVENOUS | Status: DC | PRN
  Administered 2018-04-17: 170 ug/kg/min via INTRAVENOUS

## 2018-04-17 MED ORDER — PHENYLEPHRINE HCL 10 MG/ML IJ SOLN
INTRAMUSCULAR | Status: DC | PRN
  Administered 2018-04-17: 100 ug via INTRAVENOUS

## 2018-04-17 MED ORDER — LIDOCAINE HCL (PF) 2 % IJ SOLN
INTRAMUSCULAR | Status: AC
  Filled 2018-04-17: qty 10

## 2018-04-17 MED ORDER — FENTANYL CITRATE (PF) 100 MCG/2ML IJ SOLN
INTRAMUSCULAR | Status: AC
  Filled 2018-04-17: qty 2

## 2018-04-17 MED ORDER — FENTANYL CITRATE (PF) 100 MCG/2ML IJ SOLN
INTRAMUSCULAR | Status: DC | PRN
  Administered 2018-04-17 (×2): 50 ug via INTRAVENOUS

## 2018-04-17 MED ORDER — LIDOCAINE 2% (20 MG/ML) 5 ML SYRINGE
INTRAMUSCULAR | Status: DC | PRN
  Administered 2018-04-17: 30 mg via INTRAVENOUS

## 2018-04-17 MED ORDER — PROPOFOL 10 MG/ML IV BOLUS
INTRAVENOUS | Status: DC | PRN
  Administered 2018-04-17: 100 mg via INTRAVENOUS

## 2018-04-17 NOTE — Op Note (Signed)
[facility] Gastroenterology Patient Name: Jason Harrison Procedure Date: 04/17/2018 7:15 AM MRN: [id number] Account #: [id number] Date of Birth: 1951-05-05 Admit Type: Outpatient Age: 67 Room: [facility] ENDO ROOM 2 Gender: Male Note Status: Finalized Procedure:            Colonoscopy Indications:          Screening for colorectal malignant neoplasm, This is                        the patient's first colonoscopy Providers:            Christena Deem, MD Referring MD:         Barbette Reichmann, MD (Referring MD) Medicines:            Monitored Anesthesia Care Complications:        No immediate complications. Procedure:            Pre-Anesthesia Assessment:                       - ASA Grade Assessment: II - A patient with mild                        systemic disease.                       After obtaining informed consent, the colonoscope was                        passed under direct vision. Throughout the procedure,                        the patient's blood pressure, pulse, and oxygen                        saturations were monitored continuously. The was                        introduced through the anus and advanced to the the                        cecum, identified by appendiceal orifice and ileocecal                        valve. The colonoscopy was performed without                        difficulty. The patient tolerated the procedure well.                        The quality of the bowel preparation was fair. Findings:      A few small-mouthed diverticula were found in the sigmoid colon and       descending colon.      A diffuse area of granular mucosa was found at the ileocecal valve.       Biopsies were taken with a cold forceps for histology.      The retroflexed view of the distal rectum and anal verge was normal and       showed no anal or rectal abnormalities.      The digital rectal exam was normal. Impression:           -  Preparation of the colon  was fair.                       - Diverticulosis in the sigmoid colon and in the                        descending colon.                       - Granularity at the ileocecal valve. Biopsied. Recommendation:       - Advance diet as tolerated. Procedure Code(s):    --- Professional ---                       [id number], Colonoscopy, flexible; with biopsy, single or                        multiple Diagnosis Code(s):    --- Professional ---                       Z12.11, Encounter for screening for malignant neoplasm                        of colon                       K63.89, Other specified diseases of intestine                       K57.30, Diverticulosis of large intestine without                        perforation or abscess without bleeding CPT copyright 2018 [employer]. All rights reserved. The codes documented in this report are preliminary and upon coder review may  be revised to meet current compliance requirements. Christena Deem, MD 04/17/2018 7:59:10 AM This report has been signed electronically. Number of Addenda: 0 Note Initiated On: 04/17/2018 7:15 AM Scope Withdrawal Time: 0 hours 7 minutes 37 seconds  Total Procedure Duration: 0 hours 14 minutes 56 seconds       [facility]

## 2018-04-17 NOTE — Anesthesia Post-op Follow-up Note (Signed)
Anesthesia QCDR form completed.        

## 2018-04-17 NOTE — Transfer of Care (Signed)
Immediate Anesthesia Transfer of Care Note  Patient: Jason Harrison  Procedure(s) Performed: COLONOSCOPY WITH PROPOFOL (N/A )  Patient Location: PACU and Endoscopy Unit  Anesthesia Type:General  Level of Consciousness: sedated  Airway & Oxygen Therapy: Patient Spontanous Breathing and Patient connected to nasal cannula oxygen  Post-op Assessment: Report given to RN and Post -op Vital signs reviewed and stable  Post vital signs: Reviewed and stable  Last Vitals:  Vitals Value Taken Time  BP 83/58 04/17/2018  8:01 AM  Temp    Pulse 79 04/17/2018  8:01 AM  Resp 13 04/17/2018  8:01 AM  SpO2 100 % 04/17/2018  8:01 AM  Vitals shown include unvalidated device data.  Last Pain:  Vitals:   04/17/18 0704  TempSrc: Tympanic  PainSc: 0-No pain         Complications: No apparent anesthesia complications

## 2018-04-17 NOTE — H&P (Signed)
Outpatient short stay form Pre-procedure 04/17/2018 7:17 AM Christena Deem MD  Primary Physician: Dr Barbette Reichmann  Reason for visit: Colonoscopy  History of present illness: Patient is a 67 year old male presenting today for screening colonoscopy.  This is his first colonoscopy.  He tolerated his prep well.  Does take 81 mg aspirin this been held for several days he takes no other aspirin products or blood thinning agent.   No current facility-administered medications for this encounter.   Medications Prior to Admission  Medication Sig Dispense Refill Last Dose  . aspirin EC 81 MG tablet Take 1 tablet by mouth daily.   Past Week at Unknown time  . atorvastatin (LIPITOR) 20 MG tablet Take 1 tablet by mouth at bedtime.   Past Week at Unknown time  . brimonidine (ALPHAGAN) 0.2 % ophthalmic solution Place 1 drop into both eyes 2 (two) times daily.   04/16/2018 at Unknown time  . glimepiride (AMARYL) 4 MG tablet Take 0.5 tablets by mouth daily.   Past Week at Unknown time  . INVOKANA 300 MG TABS tablet Take 1 tablet by mouth daily.   Past Week at Unknown time  . latanoprost (XALATAN) 0.005 % ophthalmic solution Place 1 drop into both eyes 2 (two) times daily.   04/16/2018 at Unknown time  . metFORMIN (GLUCOPHAGE) 1000 MG tablet Take 1 tablet by mouth 2 (two) times daily.   Past Week at Unknown time  . Multiple Vitamin (MULTIVITAMIN WITH MINERALS) TABS tablet Take 1 tablet by mouth daily.   Past Week at Unknown time  . pioglitazone (ACTOS) 15 MG tablet Take 15 mg by mouth daily.   Past Week at Unknown time  . VICTOZA 18 MG/3ML SOPN Inject 1.8 mg into the skin at bedtime.    Past Week at Unknown time  . ticagrelor (BRILINTA) 90 MG TABS tablet Take 1 tablet (90 mg total) by mouth 2 (two) times daily. (Patient not taking: Reported on 04/17/2018) 60 tablet 3 Completed Course at Unknown time  . valsartan (DIOVAN) 160 MG tablet Take 1 tablet (160 mg total) by mouth daily. (Patient not taking:  Reported on 04/17/2018) 30 tablet 5 Completed Course at Unknown time     Allergies  Allergen Reactions  . Trulicity [Dulaglutide]      Past Medical History:  Diagnosis Date  . Diabetes mellitus without complication (HCC)    type 2  . Diabetic nephropathy (HCC)   . Diabetic retinopathy (HCC)   . Hypertension     Review of systems:      Physical Exam    Heart and lungs: Rhythm without rub or gallop, lungs are bilaterally clear.    HEENT: Normocephalic atraumatic eyes are anicteric    Other:    Pertinant exam for procedure: Soft nontender nondistended bowel sounds positive normoactive.    Planned proceedures: Colonoscopy and indicated procedures. I have discussed the risks benefits and complications of procedures to include not limited to bleeding, infection, perforation and the risk of sedation and the patient wishes to proceed.    Christena Deem, MD Gastroenterology 04/17/2018  7:17 AM

## 2018-04-17 NOTE — Anesthesia Preprocedure Evaluation (Signed)
Anesthesia Evaluation  Patient identified by MRN, date of birth, ID band Patient awake    Reviewed: Allergy & Precautions, H&P , NPO status , Patient's Chart, lab work & pertinent test results  History of Anesthesia Complications Negative for: history of anesthetic complications  Airway Mallampati: III  TM Distance: <3 FB Neck ROM: limited    Dental  (+) Chipped, Poor Dentition, Missing   Pulmonary neg pulmonary ROS, neg shortness of breath,           Cardiovascular Exercise Tolerance: Good hypertension, (-) angina+ CAD, + Past MI and + Cardiac Stents  (-) DOE      Neuro/Psych negative neurological ROS  negative psych ROS   GI/Hepatic negative GI ROS, Neg liver ROS, neg GERD  ,  Endo/Other  diabetes, Type 2  Renal/GU Renal disease  negative genitourinary   Musculoskeletal   Abdominal   Peds  Hematology negative hematology ROS (+)   Anesthesia Other Findings Past Medical History: No date: Diabetes mellitus without complication (HCC)     Comment:  type 2 No date: Diabetic nephropathy (HCC) No date: Diabetic retinopathy (HCC) No date: Hypertension  Past Surgical History: 01/15/2015: CARDIAC CATHETERIZATION; N/A     Comment:  Procedure: Left Heart Cath and Coronary Angiography;                Surgeon: Dalia Heading, MD;  Location: [facility] INVASIVE CV               LAB;  Service: Cardiovascular;  Laterality: N/A; 01/15/2015: CARDIAC CATHETERIZATION; N/A     Comment:  Procedure: Coronary Stent Intervention;  Surgeon:               Iran Ouch, MD;  Location: [facility] INVASIVE CV LAB;                Service: Cardiovascular;  Laterality: N/A; No date: CORONARY ANGIOPLASTY 1972: CYSTECTOMY No date: EYE SURGERY  BMI    Body Mass Index:  29.56 kg/m      Reproductive/Obstetrics negative OB ROS                             Anesthesia Physical Anesthesia Plan  ASA: III  Anesthesia Plan:  General   Post-op Pain Management:    Induction: Intravenous  PONV Risk Score and Plan: Propofol infusion and TIVA  Airway Management Planned: Natural Airway and Nasal Cannula  Additional Equipment:   Intra-op Plan:   Post-operative Plan:   Informed Consent: I have reviewed the patients History and Physical, chart, labs and discussed the procedure including the risks, benefits and alternatives for the proposed anesthesia with the patient or authorized representative who has indicated his/her understanding and acceptance.   Dental Advisory Given  Plan Discussed with: Anesthesiologist, CRNA and Surgeon  Anesthesia Plan Comments: (Patient consented for risks of anesthesia including but not limited to:  - adverse reactions to medications - risk of intubation if required - damage to teeth, lips or other oral mucosa - sore throat or hoarseness - Damage to heart, brain, lungs or loss of life  Patient voiced understanding.)        Anesthesia Quick Evaluation

## 2018-04-17 NOTE — Anesthesia Postprocedure Evaluation (Signed)
Anesthesia Post Note  Patient: Jason Harrison  Procedure(s) Performed: COLONOSCOPY WITH PROPOFOL (N/A )  Patient location during evaluation: Endoscopy Anesthesia Type: General Level of consciousness: awake and alert Pain management: pain level controlled Vital Signs Assessment: post-procedure vital signs reviewed and stable Respiratory status: spontaneous breathing, nonlabored ventilation, respiratory function stable and patient connected to nasal cannula oxygen Cardiovascular status: blood pressure returned to baseline and stable Postop Assessment: no apparent nausea or vomiting Anesthetic complications: no     Last Vitals:  Vitals:   04/17/18 0820 04/17/18 0830  BP: 121/76 130/80  Pulse: 85 84  Resp: 11 17  Temp:    SpO2: 98% 96%    Last Pain:  Vitals:   04/17/18 0704  TempSrc: Tympanic  PainSc: 0-No pain                 Cleda Mccreedy Deyjah Kindel

## 2018-04-18 LAB — SURGICAL PATHOLOGY

## 2019-07-05 ENCOUNTER — Emergency Department: Payer: Medicare Other

## 2019-07-05 ENCOUNTER — Encounter: Payer: Self-pay | Admitting: Intensive Care

## 2019-07-05 ENCOUNTER — Other Ambulatory Visit: Payer: Self-pay

## 2019-07-05 ENCOUNTER — Emergency Department
Admission: EM | Admit: 2019-07-05 | Discharge: 2019-07-05 | Disposition: A | Discharge status: Home / Self Care | Payer: Medicare Other | Attending: Emergency Medicine | Admitting: Emergency Medicine

## 2019-07-05 DIAGNOSIS — R1012 Left upper quadrant pain: Secondary | ICD-10-CM | POA: Diagnosis present

## 2019-07-05 DIAGNOSIS — Z7982 Long term (current) use of aspirin: Secondary | ICD-10-CM | POA: Diagnosis not present

## 2019-07-05 DIAGNOSIS — Z7984 Long term (current) use of oral hypoglycemic drugs: Secondary | ICD-10-CM | POA: Diagnosis not present

## 2019-07-05 DIAGNOSIS — N200 Calculus of kidney: Secondary | ICD-10-CM

## 2019-07-05 DIAGNOSIS — E119 Type 2 diabetes mellitus without complications: Secondary | ICD-10-CM | POA: Insufficient documentation

## 2019-07-05 LAB — URINALYSIS, COMPLETE (UACMP) WITH MICROSCOPIC
Bacteria, UA: NONE SEEN
Bilirubin Urine: NEGATIVE
Glucose, UA: >=500 mg/dL — AB
Hgb urine dipstick: NEGATIVE
Ketones, ur: 20 mg/dL — AB
Leukocytes,Ua: NEGATIVE
Nitrite: NEGATIVE
Protein, ur: NEGATIVE mg/dL
Specific Gravity, Urine: >1.046 — ABNORMAL HIGH (ref 1.005–1.030)
pH: 5 (ref 5.0–8.0)

## 2019-07-05 LAB — BASIC METABOLIC PANEL
Anion gap: 11 (ref 5–15)
BUN: 22 mg/dL (ref 8–23)
CO2: 24 mmol/L (ref 22–32)
Calcium: 9.6 mg/dL (ref 8.9–10.3)
Chloride: 106 mmol/L (ref 98–111)
Creatinine, Ser: 1.16 mg/dL (ref 0.61–1.24)
GFR calc Af Amer: >60 mL/min (ref >60)
GFR calc non Af Amer: >60 mL/min (ref >60)
Glucose, Bld: 221 mg/dL — ABNORMAL HIGH (ref 70–99)
Potassium: 4.5 mmol/L (ref 3.5–5.1)
Sodium: 141 mmol/L (ref 135–145)

## 2019-07-05 LAB — CBC
HCT: 50.5 % (ref 39.0–52.0)
Hemoglobin: 16.5 g/dL (ref 13.0–17.0)
MCH: 29 pg (ref 26.0–34.0)
MCHC: 32.7 g/dL (ref 30.0–36.0)
MCV: 88.9 fL (ref 80.0–100.0)
Platelets: 243 10*3/uL (ref 150–400)
RBC: 5.68 MIL/uL (ref 4.22–5.81)
RDW: 12.6 % (ref 11.5–15.5)
WBC: 8.9 10*3/uL (ref 4.0–10.5)
nRBC: 0 % (ref 0.0–0.2)

## 2019-07-05 LAB — HEPATIC FUNCTION PANEL
ALT: 24 U/L (ref 0–44)
AST: 17 U/L (ref 15–41)
Albumin: 4.5 g/dL (ref 3.5–5.0)
Alkaline Phosphatase: 61 U/L (ref 38–126)
Bilirubin, Direct: <0.1 mg/dL (ref 0.0–0.2)
Total Bilirubin: 1.1 mg/dL (ref 0.3–1.2)
Total Protein: 7.6 g/dL (ref 6.5–8.1)

## 2019-07-05 LAB — LIPASE, BLOOD: Lipase: 70 U/L — ABNORMAL HIGH (ref 11–51)

## 2019-07-05 IMAGING — CT CT ABD-PELV W/ CM
2 of 5 series · 16 of 46 positions shown, 18 images · IV contrast (APPLIED)
Comparison: None.

CLINICAL DATA: Left flank pain. Urinary frequency.

EXAM:
CT ABDOMEN AND PELVIS WITH CONTRAST
TECHNIQUE: Multidetector CT imaging of the abdomen and pelvis was performed
using the standard protocol following bolus administration of
intravenous contrast.
CONTRAST:  100mL OMNIPAQUE IOHEXOL 300 MG/ML  SOLN

[Series 2: routine abd/pel with · axial · 0.85mm/px · z∈[-1094,-599]mm · 13 of 113 slices shown, 15 images]
[im 7/113  soft-tissue]
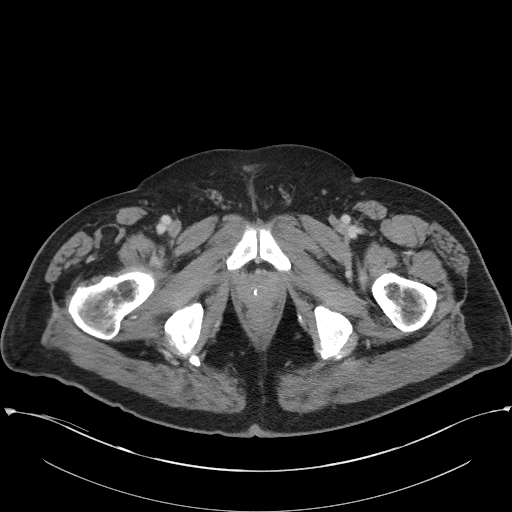
[im 7/113  bone]
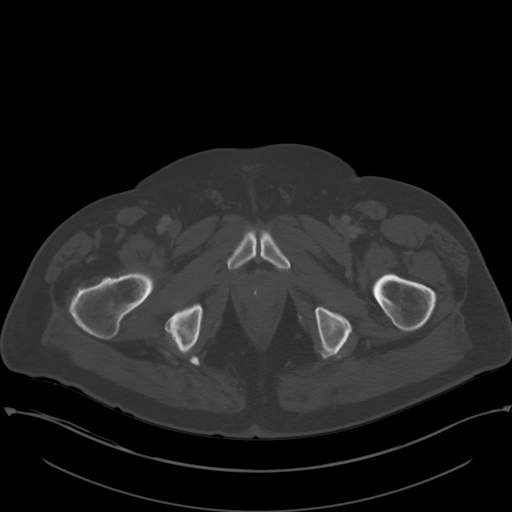
[im 13/113  soft-tissue]
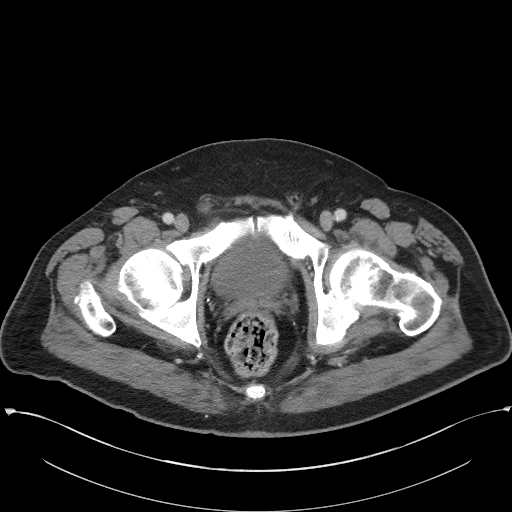
[im 25/113  soft-tissue]
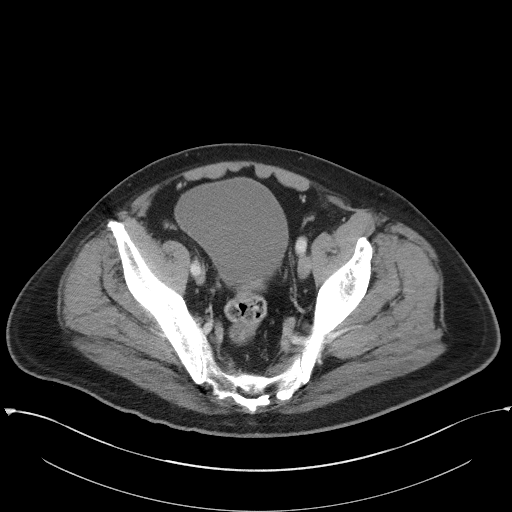
[im 32/113  soft-tissue]
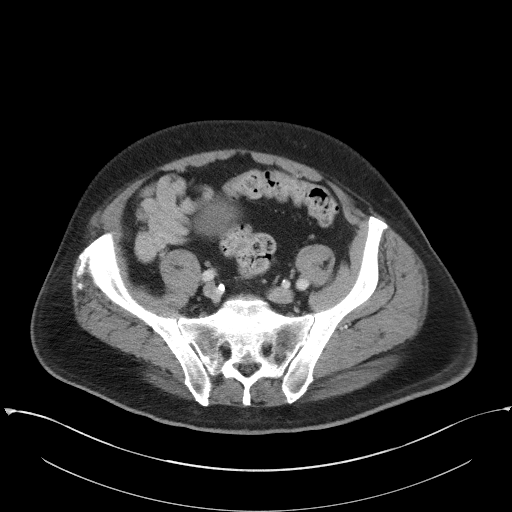
[im 38/113  soft-tissue]
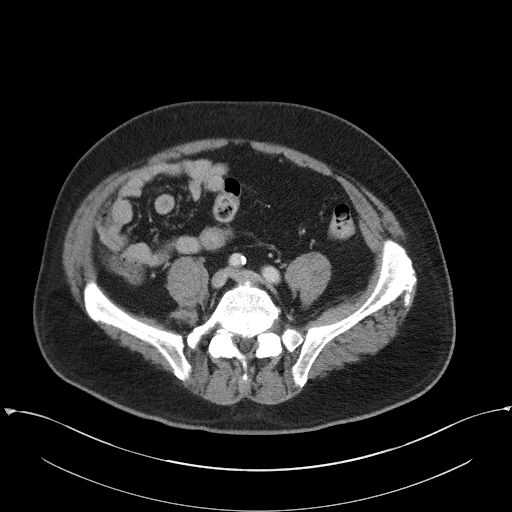
[im 50/113  soft-tissue]
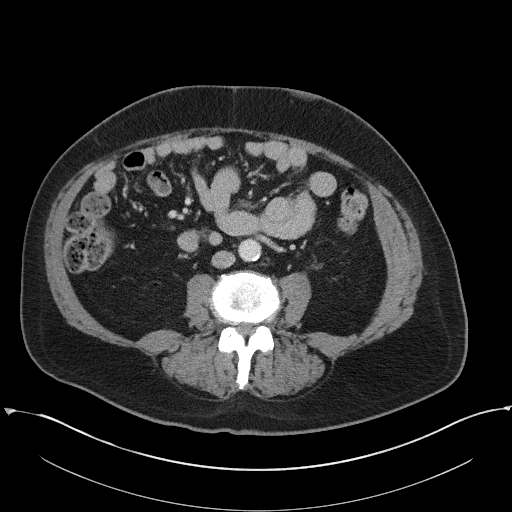
[im 57/113  soft-tissue]
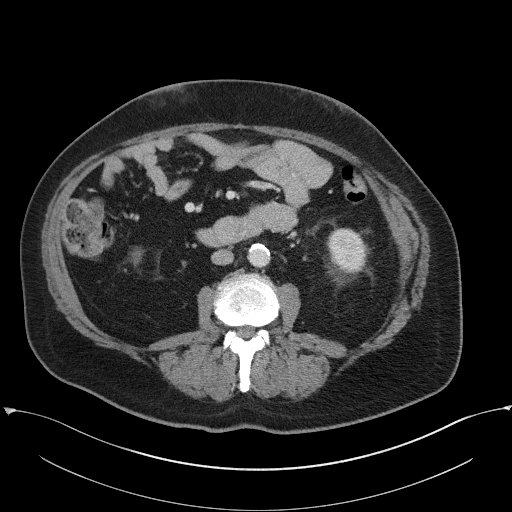
[im 63/113  soft-tissue]
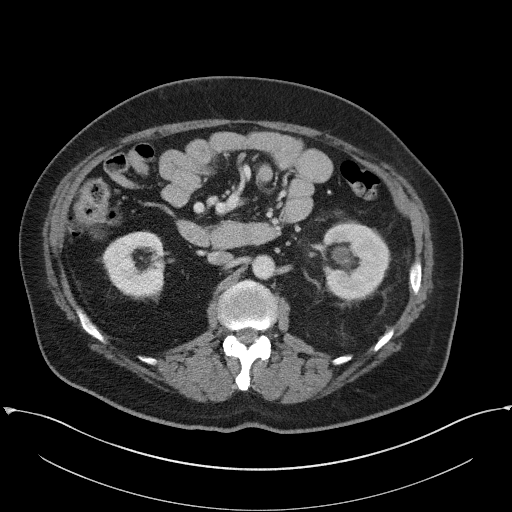
[im 75/113  soft-tissue]
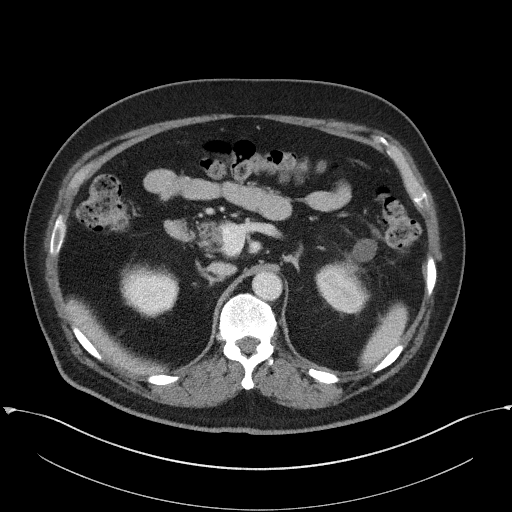
[im 75/113  bone]
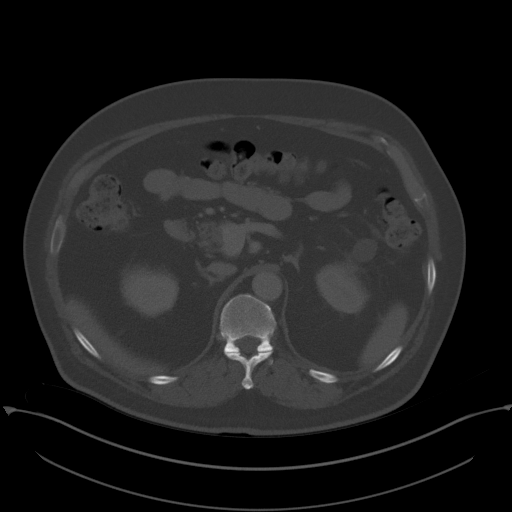
[im 81/113  soft-tissue]
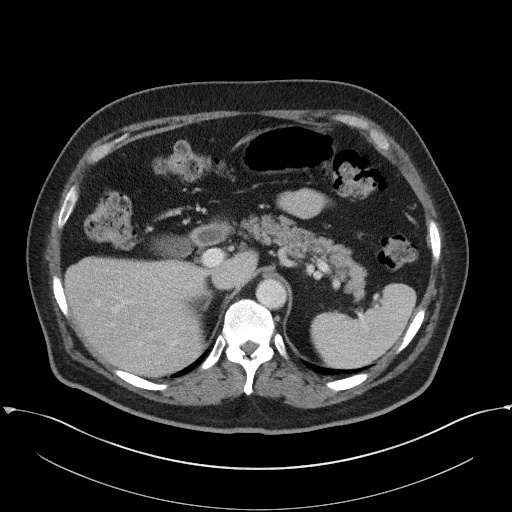
[im 88/113  soft-tissue]
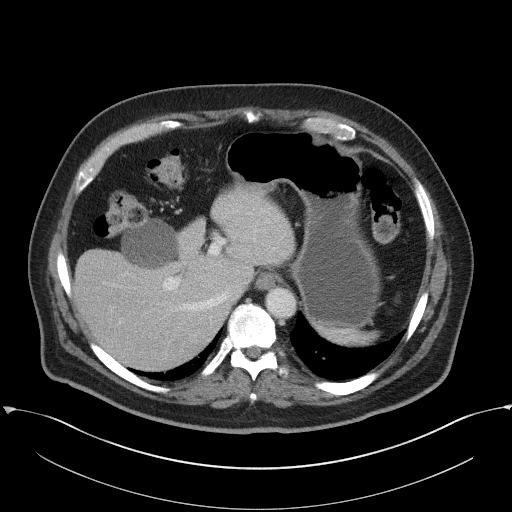
[im 100/113  soft-tissue]
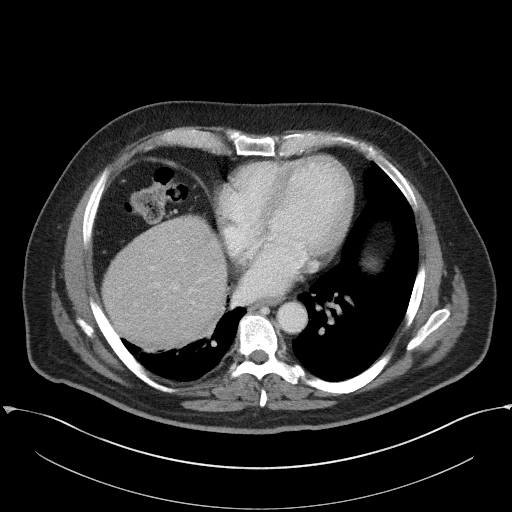
[im 106/113  soft-tissue]
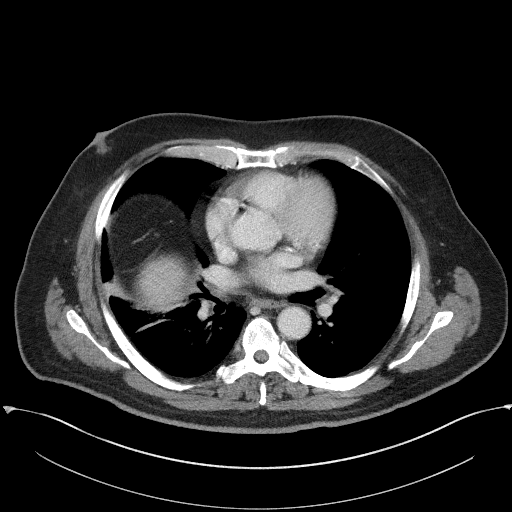

[Series 5: coronal st · coronal · 0.87mm/px · 3 of 103 slices shown]
[im 35/103  soft-tissue]
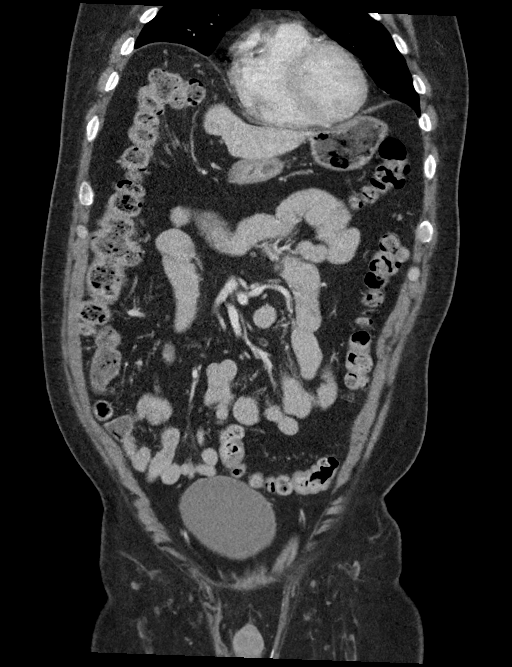
[im 46/103  soft-tissue]
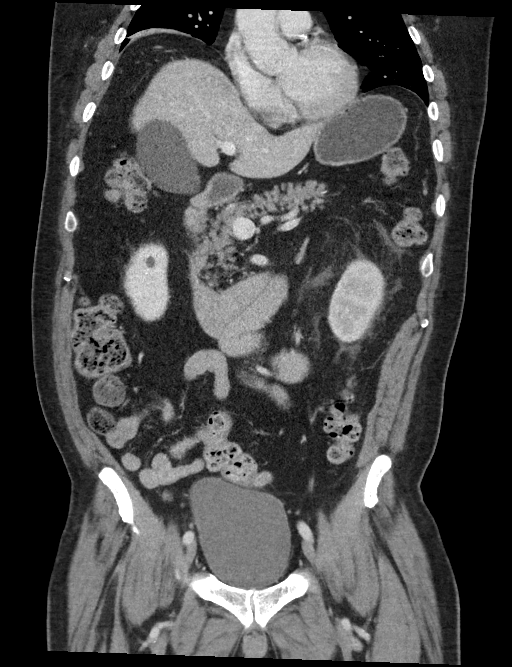
[im 57/103  soft-tissue]
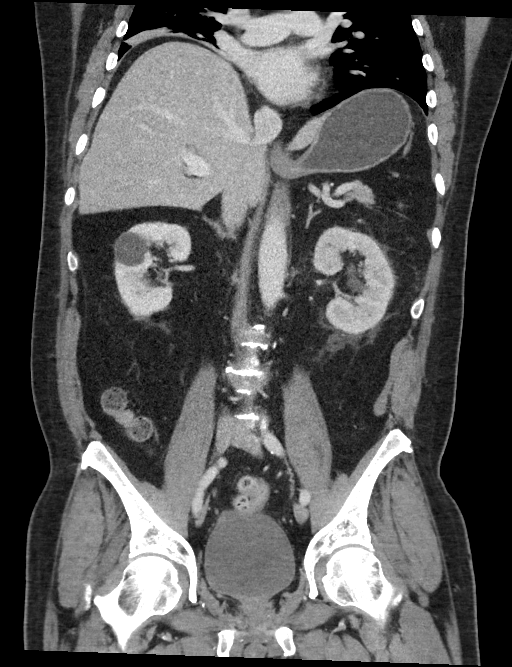

[16 of 46 positions shown; findings below may reference images not displayed]

FINDINGS: Lower chest: Right hemidiaphragm elevation with adjacent volume
loss. Normal heart size without pericardial or pleural effusion.
Multivessel coronary artery atherosclerosis.

Hepatobiliary: Too small to characterize right hepatic lobe lesion,
likely a cyst. Normal gallbladder, without biliary ductal
dilatation.

Pancreas: Pancreatic fatty replacement most significant in the head.

Spleen: Normal in size, without focal abnormality.

Adrenals/Urinary Tract: Normal adrenal glands. Bilateral renal cysts
and too small to characterize lesions. A stone at the left
ureteropelvic junction measures 1.4 cm on 49/2. There is also a
lower pole left renal collecting system 8 mm stone. Mild left-sided
hydronephrosis with decreased contrast excretion on delayed images.
No distal hydroureter. Normal urinary bladder.

Stomach/Bowel: Gastric antral underdistention.

Colonic stool burden suggests constipation.  Normal small bowel.

Vascular/Lymphatic: Aortic and branch vessel atherosclerosis. No
abdominopelvic adenopathy.

Reproductive: Normal prostate.

Other: No significant free fluid. Tiny periumbilical fat containing
ventral abdominal wall hernia.

Musculoskeletal: Degenerative disc disease at L4-5.
IMPRESSION: 1. Left-sided urinary tract obstruction secondary to a 1.4 cm stone
at the left ureteropelvic junction.
2. Left nephrolithiasis.
3. Possible constipation.
4. Aortic Atherosclerosis ([id number]). Coronary artery
atherosclerosis.

## 2019-07-05 MED ORDER — SODIUM CHLORIDE 0.9 % IV BOLUS
500.0000 mL | Freq: Once | INTRAVENOUS | Status: AC
  Administered 2019-07-05: 500 mL via INTRAVENOUS

## 2019-07-05 MED ORDER — IBUPROFEN 400 MG PO TABS
400.0000 mg | ORAL_TABLET | ORAL | Status: AC
  Administered 2019-07-05: 19:00:00 400 mg via ORAL
  Filled 2019-07-05: qty 1

## 2019-07-05 MED ORDER — HYDROCODONE-ACETAMINOPHEN 5-325 MG PO TABS: 1.0000 | ORAL_TABLET | Freq: Four times a day (QID) | ORAL | 0 refills | Status: DC | PRN

## 2019-07-05 MED ORDER — HYDROMORPHONE HCL 1 MG/ML IJ SOLN
0.5000 mg | INTRAMUSCULAR | Status: AC
  Administered 2019-07-05: 0.5 mg via INTRAVENOUS
  Filled 2019-07-05: qty 1

## 2019-07-05 MED ORDER — ONDANSETRON 4 MG PO TBDP: 4.0000 mg | ORAL_TABLET | Freq: Four times a day (QID) | ORAL | 0 refills | Status: DC | PRN

## 2019-07-05 MED ORDER — MORPHINE SULFATE (PF) 4 MG/ML IV SOLN
4.0000 mg | Freq: Once | INTRAVENOUS | Status: AC
  Administered 2019-07-05: 4 mg via INTRAVENOUS
  Filled 2019-07-05: qty 1

## 2019-07-05 MED ORDER — IOHEXOL 300 MG/ML  SOLN
100.0000 mL | Freq: Once | INTRAMUSCULAR | Status: AC | PRN
  Administered 2019-07-05: 100 mL via INTRAVENOUS
  Filled 2019-07-05: qty 100

## 2019-07-05 MED ORDER — HYDROCODONE-ACETAMINOPHEN 5-325 MG PO TABS
1.0000 | ORAL_TABLET | Freq: Once | ORAL | Status: AC
  Administered 2019-07-05: 1 via ORAL
  Filled 2019-07-05: qty 1

## 2019-07-05 MED ORDER — IOHEXOL 9 MG/ML PO SOLN
1000.0000 mL | Freq: Once | ORAL | Status: AC
  Administered 2019-07-05: 1000 mL via ORAL
  Filled 2019-07-05: qty 1000

## 2019-07-05 MED ORDER — KETOROLAC TROMETHAMINE 30 MG/ML IJ SOLN
30.0000 mg | Freq: Once | INTRAMUSCULAR | Status: AC
  Administered 2019-07-05: 30 mg via INTRAVENOUS
  Filled 2019-07-05: qty 1

## 2019-07-05 MED ORDER — OXYCODONE-ACETAMINOPHEN 5-325 MG PO TABS
1.0000 | ORAL_TABLET | ORAL | Status: DC | PRN
  Administered 2019-07-05: 1 via ORAL
  Filled 2019-07-05: qty 1

## 2019-07-05 MED ORDER — ONDANSETRON HCL 4 MG/2ML IJ SOLN
4.0000 mg | Freq: Once | INTRAMUSCULAR | Status: AC
  Administered 2019-07-05: 4 mg via INTRAVENOUS
  Filled 2019-07-05: qty 2

## 2019-07-05 NOTE — ED Notes (Signed)
Pt stating he is unsure if he is able to drink Omnipaque solution. Pt encouraged to let morphine and zofran take effect and try to drink some in a few minutes.

## 2019-07-05 NOTE — ED Notes (Signed)
Patient transported to CT 

## 2019-07-05 NOTE — ED Triage Notes (Signed)
Patient c/o left flank pain for a couple days that worsened today. Reports urinary frequency

## 2019-07-05 NOTE — ED Notes (Signed)
Pt discharged to lobby awaiting nephew for transport

## 2019-07-05 NOTE — ED Notes (Signed)
ED Provider at bedside. 

## 2019-07-05 NOTE — ED Provider Notes (Signed)
[facility] Emergency Department Provider Note   ____________________________________________   First MD Initiated Contact with Patient 07/05/19 1333     (approximate)  I have reviewed the triage vital signs and the nursing notes.   HISTORY  Chief Complaint Flank Pain (left)    HPI Jason Harrison is a 69 y.o. male here for evaluation of left flank and lower abdominal pain  Patient reports on Tuesday he ate something about 1520 minutes after that experience a very sudden feeling like he needed to have a bowel movement.  However he is also been experiencing now much more of a pain in his left flank and "kidney area"  Patient reports had a weird feeling like he needs to empty his bladder, he is able to urinate but just feels like there is some sort of urgency feeling  Pain is always located on the left side now primarily in the left flank.  No chest pain no trouble breathing.  Denies exposure to coronavirus, but he did have a nephew that had Covid but he did not see him until 14 days after his diagnosis I was around the time of Thanksgiving  No fevers or chills.  Some nausea, vomited this morning but reports it was because he feels like he drank a container of bad juice that was expired, and after he threw that back up he has been feeling much better does not feel like he needs to vomit  No chest pain.  Pain currently 8/2 out of 10 in the left flank region   Past Medical History:  Diagnosis Date  . Diabetes mellitus without complication ([place])    type 2  . Diabetic nephropathy ([facility])   . Diabetic retinopathy [facility])     Patient Active Problem List   Diagnosis Date Noted  . Elevated troponin 01/13/2015    Past Surgical History:  Procedure Laterality Date  . CARDIAC CATHETERIZATION N/A 01/15/2015   Procedure: Left Heart Cath and Coronary Angiography;  Surgeon: Teodoro Spray, MD;  Location: [place] CV LAB;  Service: Cardiovascular;  Laterality:  N/A;  . CARDIAC CATHETERIZATION N/A 01/15/2015   Procedure: Coronary Stent Intervention;  Surgeon: Wellington Hampshire, MD;  Location: [place] CV LAB;  Service: Cardiovascular;  Laterality: N/A;  . COLONOSCOPY WITH PROPOFOL N/A 04/17/2018   Procedure: COLONOSCOPY WITH PROPOFOL;  Surgeon: Lollie Sails, MD;  Location: [facility] ENDOSCOPY;  Service: Endoscopy;  Laterality: N/A;  . CORONARY ANGIOPLASTY    . CYSTECTOMY  1972  . EYE SURGERY      Prior to Admission medications   Medication Sig Start Date End Date Taking? Authorizing Provider  aspirin EC 81 MG tablet Take 1 tablet by mouth daily.    [provider]  atorvastatin (LIPITOR) 20 MG tablet Take 1 tablet by mouth at bedtime. 12/16/14   [provider]  brimonidine (ALPHAGAN) 0.2 % ophthalmic solution Place 1 drop into both eyes 2 (two) times daily. 12/26/14   [provider]  glimepiride (AMARYL) 4 MG tablet Take 0.5 tablets by mouth daily. 12/30/14   [provider]  HYDROcodone-acetaminophen (NORCO/VICODIN) 5-325 MG tablet Take 1-2 tablets by mouth every 6 (six) hours as needed for moderate pain or severe pain. 07/05/19   Delman Kitten, MD  INVOKANA 300 MG TABS tablet Take 1 tablet by mouth daily. 01/06/15   [provider]  latanoprost (XALATAN) 0.005 % ophthalmic solution Place 1 drop into both eyes 2 (two) times daily.    [provider]  metFORMIN (GLUCOPHAGE) 1000 MG tablet Take 1 tablet by mouth 2 (two) times daily. 11/11/14   [provider]  Multiple Vitamin (MULTIVITAMIN WITH MINERALS) TABS tablet Take 1 tablet by mouth daily.    [provider]  ondansetron (ZOFRAN ODT) 4 MG disintegrating tablet Take 1 tablet (4 mg total) by mouth every 6 (six) hours as needed for nausea or vomiting. 07/05/19   Sharyn Creamer, MD  pioglitazone (ACTOS) 15 MG tablet Take 15 mg by mouth daily.    [provider]  ticagrelor (BRILINTA) 90 MG TABS tablet Take 1 tablet (90 mg total)  by mouth 2 (two) times daily. Patient not taking: Reported on 04/17/2018 01/16/15   Barbette Reichmann, MD  valsartan (DIOVAN) 160 MG tablet Take 1 tablet (160 mg total) by mouth daily. Patient not taking: Reported on 04/17/2018 01/16/15   Barbette Reichmann, MD  VICTOZA 18 MG/3ML SOPN Inject 1.8 mg into the skin at bedtime.  11/09/14   [provider]    Allergies Trulicity [dulaglutide]  History reviewed. No pertinent family history.  Social History Social History   Tobacco Use  . Smoking status: Never Smoker  . Smokeless tobacco: Never Used  Substance Use Topics  . Alcohol use: No  . Drug use: Never    Review of Systems Constitutional: No fever/chills Eyes: No visual changes. ENT: No sore throat. Cardiovascular: Denies chest pain. Respiratory: Denies shortness of breath. Gastrointestinal: See HPI Genitourinary: Negative for dysuria.  Urgent see at times Musculoskeletal: Negative for back pain except along the left kidney area, points of his left mid to lower flank. Skin: Negative for rash. Neurological: Negative for headaches, areas of focal weakness or numbness.    ____________________________________________   PHYSICAL EXAM:  VITAL SIGNS: ED Triage Vitals  Enc Vitals Group     BP 07/05/19 1256 (!) 184/96     Pulse Rate 07/05/19 1256 (!) 101     Resp 07/05/19 1256 19     Temp 07/05/19 1256 97.9 F (36.6 C)     Temp Source 07/05/19 1256 Oral     SpO2 07/05/19 1256 100 %     Weight 07/05/19 1258 200 lb (90.7 kg)     Height 07/05/19 1258 5\' 10"  (1.778 m)     Head Circumference --      Peak Flow --      Pain Score 07/05/19 1303 8     Pain Loc --      Pain Edu? --      Excl. in GC? --     Constitutional: Alert and oriented.  Appears in pain pinnings sitting up able to ambulate independently, but appears in pain and slightly pale. Eyes: Conjunctivae are normal. Head: Atraumatic. Nose: No congestion/rhinnorhea. Mouth/Throat: Mucous membranes are  moist. Neck: No stridor.  Cardiovascular: Normal rate, regular rhythm. Grossly normal heart sounds.  Good peripheral circulation. Respiratory: Normal respiratory effort.  No retractions. Lungs CTAB. Gastrointestinal: Soft and nontender he does report some tenderness in the left lower quadrant without peritonitis. No distention.  He does report significant left-sided CVA angle tenderness to percussion.  None on the right. Musculoskeletal: No lower extremity tenderness nor edema. Neurologic:  Normal speech and language. No gross focal neurologic deficits are appreciated.  Skin:  Skin is somewhat cool, dry and intact. No rash noted except for slight lacy appearance over the area of the abdomen from about the umbilicus down to the level of the pubic symphysis. Psychiatric: Mood and affect are normal. Speech and  behavior are normal.  ____________________________________________   LABS (all labs ordered are listed, but only abnormal results are displayed)  Labs Reviewed  BASIC METABOLIC PANEL - Abnormal; Notable for the following components:      Result Value   Glucose, Bld 221 (*)    All other components within normal limits  LIPASE, BLOOD - Abnormal; Notable for the following components:   Lipase 70 (*)    All other components within normal limits  CBC  HEPATIC FUNCTION PANEL  URINALYSIS, COMPLETE (UACMP) WITH MICROSCOPIC   ____________________________________________  EKG  Reviewed inter by me at 1350 Heart rate 98 QRS 150 QTc 500 Oh normal sinus rhythm, right bundle branch block.  No evidence of acute ischemia denoted ____________________________________________  RADIOLOGY  CT ABDOMEN PELVIS W CONTRAST  Result Date: 07/05/2019 CLINICAL DATA:  Left flank pain. Urinary frequency. EXAM: CT ABDOMEN AND PELVIS WITH CONTRAST TECHNIQUE: Multidetector CT imaging of the abdomen and pelvis was performed using the standard protocol following bolus administration of intravenous contrast.  CONTRAST:  OMNIPAQUE IOHEXOL 300 MG/ML  SOLN COMPARISON:  None. FINDINGS: Lower chest: Right hemidiaphragm elevation with adjacent volume loss. Normal heart size without pericardial or pleural effusion. Multivessel coronary artery atherosclerosis. Hepatobiliary: Too small to characterize right hepatic lobe lesion, likely a cyst. Normal gallbladder, without biliary ductal dilatation. 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 Spleen: Normal in size, without focal abnormality. Adrenals/Urinary Tract: Normal adrenal glands. Bilateral renal cysts and too small to characterize lesions. A stone at the left ureteropelvic junction measures 1.4 cm on 49/2. There is also a lower pole left renal collecting system 8 mm stone. Mild left-sided hydronephrosis with decreased contrast excretion on delayed images. No distal hydroureter. Normal urinary bladder. Stomach/Bowel: Gastric antral underdistention. Colonic stool burden suggests constipation.  Normal small bowel. Vascular/Lymphatic: Aortic and branch vessel atherosclerosis. No abdominopelvic adenopathy. Reproductive: Normal prostate. Other: No significant free fluid. Tiny periumbilical fat containing ventral abdominal wall hernia. 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 IMPRESSION: 1. Left-sided urinary tract obstruction secondary to a 1.4 cm stone at the left ureteropelvic junction. 2. Left nephrolithiasis. 3. Possible constipation. 4. Aortic Atherosclerosis (ICD10-I70.0). Coronary artery atherosclerosis. Electronically Signed   By: Jeronimo Greaves M.D.   On: 07/05/2019 15:25    CT imaging reviewed, also discussed with urology Dr. Thea Silversmith.  The large stone noted on the left ____________________________________________   PROCEDURES  Procedure(s) performed: None  Procedures  Critical Care performed: No  ____________________________________________   INITIAL IMPRESSION / ASSESSMENT AND PLAN / ED COURSE  Pertinent  labs & imaging results that were available during my care of the patient were reviewed by me and considered in my medical decision making (see chart for details).   Differential diagnosis includes but is not limited to, abdominal perforation, aortic dissection, cholecystitis, appendicitis, diverticulitis, colitis, esophagitis/gastritis, kidney stone, pyelonephritis, urinary tract infection, aortic aneurysm. All are considered in decision and treatment plan. Based upon the patient's presentation and risk factors, feel that significant work-up and CT scan is warranted.  Denies cardiac or respiratory symptoms.  No obvious coronavirus-like symptoms, but does report being around a nephew but not until after the nephew is out of quarantine.  Does not appear to be high risk for Covid.     Clinical Course as of Jul 05 1923  Caleen Essex Jul 05, 2019  1449 Patient reports pain is down to about a 5, still sitting up appears uncomfortable reporting cannot find a comfortable position.  Agreeable to receiving additional dose of  morphine.  Alert fully oriented awaiting CT.   [MQ]  1825 Patient pain is much better, feeling improved. Awaiting UA. Will trial clear fluids.    [MQ]  1911 Patient able to provide urine sample.  Anticipates calling family to give him a ride home.  He will not be driving.  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  Reports his pain essentially gone.  He is awake alert sitting up in a chair resting comfortably.  Reassess vital signs, he is tachycardic heart rate about 120 at this time.  We discussed this and encouraged that we could provide  additional fluids, monitoring his heart rate was elevated and may benefit from further fluids or observation. Offered to also perform another EKG to assess his heart's fast beat but he declines reporting feeling well. Denies history of AFib. No history of CHF and last echo EF 40-45%. Patient however states that he feels much better, he is comfortable with the plan to go home and lives nearby and he will return right away if any concerns come up or he begins to feel worse but right now he is feeling a whole lot better, has been able to up walking would like to be able to go home.  No chest pain.  No shortness of breath.  Feels much better.  I will prescribe the patient a narcotic pain medicine due to their condition which I anticipate will cause at least moderate pain short term. I discussed with the patient safe use of narcotic pain medicines, and that they are not to drive, work in dangerous areas, or ever take more than prescribed (no more than 1 pill every 6 hours). We discussed that this is the type of medication that can be  overdosed on and the risks of this type of medicine. Patient is very agreeable to only use as prescribed and to never use more than prescribed.   ----------------------------------------- 8:01 PM on 07/05/2019 -----------------------------------------  Patient resting comfortably no distress.  Heart rate 122, still elevated but patient denies any shortness of breath, denies any ongoing pain or symptoms.  Feels well is ambulated drinking fluids.  Did not want anything to eat right now as he plans to get food when he gets home.  Return precautions and treatment recommendations and follow-up discussed with the patient who is agreeable with the plan.     ____________________________________________   FINAL CLINICAL IMPRESSION(S) / ED DIAGNOSES  Final diagnoses:  Kidney stone on left side        Note:  This document was prepared using Dragon voice recognition  software and may include unintentional dictation errors       Sharyn Creamer, MD 07/05/19 2001

## 2019-07-09 ENCOUNTER — Encounter: Payer: Self-pay | Admitting: Urology

## 2019-07-09 ENCOUNTER — Other Ambulatory Visit: Payer: Self-pay | Admitting: Radiology

## 2019-07-09 ENCOUNTER — Other Ambulatory Visit
Admission: RE | Admit: 2019-07-09 | Discharge: 2019-07-09 | Disposition: A | Discharge status: Home / Self Care | Payer: Medicare Other | Source: Ambulatory Visit | Attending: Urology | Admitting: Urology

## 2019-07-09 ENCOUNTER — Ambulatory Visit (INDEPENDENT_AMBULATORY_CARE_PROVIDER_SITE_OTHER): Payer: Medicare Other | Admitting: Urology

## 2019-07-09 ENCOUNTER — Other Ambulatory Visit: Payer: Self-pay

## 2019-07-09 VITALS — BP 162/94 | HR 125 | Ht 70.0 in | Wt 218.0 lb

## 2019-07-09 DIAGNOSIS — N201 Calculus of ureter: Secondary | ICD-10-CM

## 2019-07-09 DIAGNOSIS — R109 Unspecified abdominal pain: Secondary | ICD-10-CM

## 2019-07-09 DIAGNOSIS — K5903 Drug induced constipation: Secondary | ICD-10-CM | POA: Diagnosis not present

## 2019-07-09 DIAGNOSIS — Z20822 Contact with and (suspected) exposure to covid-19: Secondary | ICD-10-CM | POA: Insufficient documentation

## 2019-07-09 DIAGNOSIS — Z01812 Encounter for preprocedural laboratory examination: Secondary | ICD-10-CM | POA: Diagnosis present

## 2019-07-09 LAB — URINALYSIS, COMPLETE
Bilirubin, UA: NEGATIVE
Leukocytes,UA: NEGATIVE
Nitrite, UA: NEGATIVE
Protein,UA: NEGATIVE
Specific Gravity, UA: 1.02 (ref 1.005–1.030)
Urobilinogen, Ur: 0.2 mg/dL (ref 0.2–1.0)
pH, UA: 5 (ref 5.0–7.5)

## 2019-07-09 LAB — MICROSCOPIC EXAMINATION: Bacteria, UA: NONE SEEN

## 2019-07-09 LAB — SARS CORONAVIRUS 2 (TAT 6-24 HRS): SARS Coronavirus 2: NEGATIVE

## 2019-07-09 MED ORDER — HYDROCODONE-ACETAMINOPHEN 5-325 MG PO TABS: 1.0000 | ORAL_TABLET | Freq: Four times a day (QID) | ORAL | 0 refills | Status: DC | PRN

## 2019-07-09 NOTE — H&P (View-Only) (Signed)
07/09/2019 9:26 AM   Jason Harrison 05/10/1951 [id number]  Referring provider: Barbette Reichmann, MD [address] [facility] [place],  [place] [address]  Chief Complaint  Patient presents with  . Nephrolithiasis    HPI: 69 year old male with left flank pain who presents today to establish care for a large left UPJ stone.  Notably he was seen and evaluated emergency room on 07/05/2019 with nausea vomiting and left flank pain.  He was found to have a large 1.4 cm left UPJ stone along with a 8 mm left lower pole stone.  He had mild left hydroureteronephrosis with some surrounding perinephric stranding.  Hounsfield units approximately 1500.  Lab work revealed normal WBC and creatinine.  UA was also unremarkable.  He did have glucose in his urine.  He continues to have intermittent left flank pain which is fairly severe.  He is having to take narcotics on a regular basis.  He also mentions today that he is very constipated has not had a bowel movement since the first.  He is not taking anything for this and is starting to feel distended and uncomfortable.  No fevers or chills.  No dysuria or gross hematuria.  No previous history of kidney stones.   PMH: Past Medical History:  Diagnosis Date  . Diabetes mellitus without complication ([facility])    type 2  . Diabetic nephropathy ([facility])   . Diabetic retinopathy [facility])     Surgical History: Past Surgical History:  Procedure Laterality Date  . CARDIAC CATHETERIZATION N/A 01/15/2015   Procedure: Left Heart Cath and Coronary Angiography;  Surgeon: Dalia Heading, MD;  Location: [facility] INVASIVE CV LAB;  Service: Cardiovascular;  Laterality: N/A;  . CARDIAC CATHETERIZATION N/A 01/15/2015   Procedure: Coronary Stent Intervention;  Surgeon: Iran Ouch, MD;  Location: [facility] INVASIVE CV LAB;  Service: Cardiovascular;  Laterality: N/A;  . COLONOSCOPY WITH PROPOFOL N/A 04/17/2018   Procedure: COLONOSCOPY WITH PROPOFOL;  Surgeon:  Christena Deem, MD;  Location: [facility] ENDOSCOPY;  Service: Endoscopy;  Laterality: N/A;  . CORONARY ANGIOPLASTY    . CYSTECTOMY  1972  . EYE SURGERY      Home Medications:  Allergies as of 07/09/2019      Reactions   Trulicity [dulaglutide]       Medication List       Accurate as of July 09, 2019  9:26 AM. If you have any questions, ask your nurse or doctor.        aspirin EC 81 MG tablet Take 1 tablet by mouth daily.   atorvastatin 20 MG tablet Commonly known as: LIPITOR Take 1 tablet by mouth at bedtime.   brimonidine 0.2 % ophthalmic solution Commonly known as: ALPHAGAN Place 1 drop into both eyes 2 (two) times daily.   glimepiride 4 MG tablet Commonly known as: AMARYL Take 0.5 tablets by mouth daily.   HYDROcodone-acetaminophen 5-325 MG tablet Commonly known as: NORCO/VICODIN Take 1-2 tablets by mouth every 6 (six) hours as needed for moderate pain or severe pain.   Invokana 300 MG Tabs tablet Generic drug: canagliflozin Take 1 tablet by mouth daily.   latanoprost 0.005 % ophthalmic solution Commonly known as: XALATAN Place 1 drop into both eyes 2 (two) times daily.   metFORMIN 1000 MG tablet Commonly known as: GLUCOPHAGE Take 1 tablet by mouth 2 (two) times daily.   multivitamin with minerals Tabs tablet Take 1 tablet by mouth daily.   ondansetron 4 MG disintegrating tablet Commonly known as: Zofran  ODT Take 1 tablet (4 mg total) by mouth every 6 (six) hours as needed for nausea or vomiting.   pioglitazone 15 MG tablet Commonly known as: ACTOS Take 15 mg by mouth daily.   ticagrelor 90 MG Tabs tablet Commonly known as: BRILINTA Take 1 tablet (90 mg total) by mouth 2 (two) times daily.   valsartan 160 MG tablet Commonly known as: DIOVAN Take 1 tablet (160 mg total) by mouth daily.   Victoza 18 MG/3ML Sopn Generic drug: liraglutide Inject 1.8 mg into the skin at bedtime.       Allergies:  Allergies  Allergen Reactions  . Trulicity  [Dulaglutide]     Family History: No family history on file.  Social History:  reports that he has never smoked. He has never used smokeless tobacco. He reports that he does not drink alcohol or use drugs.  ROS: UROLOGY Frequent Urination?: No Hard to postpone urination?: No Burning/pain with urination?: No Get up at night to urinate?: No Leakage of urine?: No Urine stream starts and stops?: Yes Trouble starting stream?: Yes Do you have to strain to urinate?: Yes Blood in urine?: No Urinary tract infection?: No Sexually transmitted disease?: No Injury to kidneys or bladder?: No Painful intercourse?: No Weak stream?: No Erection problems?: No Penile pain?: No  Gastrointestinal Nausea?: No Vomiting?: No Indigestion/heartburn?: No Diarrhea?: No Constipation?: No  Constitutional Fever: No Night sweats?: No Weight loss?: No Fatigue?: No  Skin Skin rash/lesions?: No Itching?: No  Eyes Blurred vision?: No Double vision?: No  Ears/Nose/Throat Sore throat?: No Sinus problems?: No  Hematologic/Lymphatic Swollen glands?: No Easy bruising?: No  Cardiovascular Leg swelling?: No Chest pain?: No  Respiratory Cough?: No Shortness of breath?: No  Endocrine Excessive thirst?: No  Musculoskeletal Back pain?: No(Left Flank) Joint pain?: No  Neurological Headaches?: No Dizziness?: No  Psychologic Depression?: No Anxiety?: No  Physical Exam: BP (!) 162/94   Pulse (!) 125   Ht 5' 10" (1.778 m)   Wt 218 lb (98.9 kg)   BMI 31.28 kg/m   Constitutional:  Alert and oriented, No acute distress. HEENT: [place] AT, moist mucus membranes.  Trachea midline, no masses.  Pupils pinpoint. Cardiovascular: No clubbing, cyanosis, or edema. Respiratory: Normal respiratory effort, no increased work of breathing. GI: Abdomen is soft, nontender, nondistended, no abdominal masses GU: + Left CVA tenderness. Skin: No rashes, bruises or suspicious lesions. Neurologic: Grossly  intact, no focal deficits, moving all 4 extremities. Psychiatric: Normal mood and affect.  Laboratory Data: Lab Results  Component Value Date   WBC 8.9 07/05/2019   HGB 16.5 07/05/2019   HCT 50.5 07/05/2019   MCV 88.9 07/05/2019   PLT 243 07/05/2019    Lab Results  Component Value Date   CREATININE 1.16 07/05/2019    Urinalysis UA today unremarkable other than for glucose, see epic  Pertinent Imaging: CLINICAL DATA:  Left flank pain. Urinary frequency.  EXAM: CT ABDOMEN AND PELVIS WITH CONTRAST  TECHNIQUE: Multidetector CT imaging of the abdomen and pelvis was performed using the standard protocol following bolus administration of intravenous contrast.  CONTRAST:  100mL OMNIPAQUE IOHEXOL 300 MG/ML  SOLN  COMPARISON:  None.  FINDINGS: Lower chest: Right hemidiaphragm elevation with adjacent volume loss. Normal heart size without pericardial or pleural effusion. Multivessel coronary artery atherosclerosis.  Hepatobiliary: Too small to characterize right hepatic lobe lesion, likely a cyst. Normal gallbladder, without biliary ductal dilatation.  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  Spleen: Normal in size, without   focal abnormality.  Adrenals/Urinary Tract: Normal adrenal glands. Bilateral renal cysts and too small to characterize lesions. A stone at the left ureteropelvic junction measures 1.4 cm on 49/2. There is also a lower pole left renal collecting system 8 mm stone. Mild left-sided hydronephrosis with decreased contrast excretion on delayed images. No distal hydroureter. Normal urinary bladder.  Stomach/Bowel: Gastric antral underdistention.  Colonic stool burden suggests constipation.  Normal small bowel.  Vascular/Lymphatic: Aortic and branch vessel atherosclerosis. No abdominopelvic adenopathy.  Reproductive: Normal prostate.  Other: No significant free fluid. Tiny periumbilical fat containing  ventral abdominal wall hernia.  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  IMPRESSION: 1. Left-sided urinary tract obstruction secondary to a 1.4 cm stone at the left ureteropelvic junction. 2. Left nephrolithiasis. 3. Possible constipation. 4. Aortic Atherosclerosis (ICD10-I70.0). Coronary artery atherosclerosis.   Electronically Signed   By: Kyle  Talbot M.D.   On: 07/05/2019 15:25  Above CT scan images were personally reviewed today.  Left renal pelvic stone has Hounsfield density of approximately 1500 cc, lower pole stone 1350 Hounsfield units.  Assessment & Plan:    1. Obstruction of left ureteropelvic junction (UPJ) due to stone Large obstructing left UPJ stone along with nonobstructing 8 mm left lower pole stone  No evidence of infection  Poorly controlled pain requiring narcotics on a regular basis, unable to work.  Given the size and number of stones as well as the severe pain, would like to go ahead and decompress his left kidney and stent him within the next couple of days.  We will plan for staged procedure to stone down the road once his ureter has had time to dilate.  He is agreeable this plan.  Alternatives including shockwave lithotripsy was also discussed.  Given the fairly large size of the stone and density, efficacy rate would be significantly lower for this option.  Risks and benefits of ureteroscopy/ stent were reviewed including but not limited to infection, bleeding, pain, ureteral injury which could require open surgery versus prolonged indwelling if ureteral perforation occurs, persistent stone disease, requirement for staged procedure, possible stent, and global anesthesia risks. Patient expressed understanding and desires to proceed with ureteroscopy.  - Urinalysis, Complete - CULTURE, URINE COMPREHENSIVE  2. Left flank pain Vicodin refill x10  3. Drug-induced constipation Strongly recommend stopping by pharmacy to pick up MiraLAX  as well as stool softener twice daily for maintenance   Althia Egolf, MD  [facility] [address], [address] [place], [place] [address] [phone]  

## 2019-07-09 NOTE — H&P (View-Only) (Signed)
07/09/2019 9:26 AM   Jason Harrison 05/10/1951 [id number]  Referring provider: Barbette Reichmann, MD [address] [place],  [place] [address]  Chief Complaint  Patient presents with  . Nephrolithiasis    HPI: 69 year old male with left flank pain who presents today to establish care for a large left UPJ stone.  Notably he was seen and evaluated emergency room on 07/05/2019 with nausea vomiting and left flank pain.  He was found to have a large 1.4 cm left UPJ stone along with a 8 mm left lower pole stone.  He had mild left hydroureteronephrosis with some surrounding perinephric stranding.  Hounsfield units approximately 1500.  Lab work revealed normal WBC and creatinine.  UA was also unremarkable.  He did have glucose in his urine.  He continues to have intermittent left flank pain which is fairly severe.  He is having to take narcotics on a regular basis.  He also mentions today that he is very constipated has not had a bowel movement since the first.  He is not taking anything for this and is starting to feel distended and uncomfortable.  No fevers or chills.  No dysuria or gross hematuria.  No previous history of kidney stones.   PMH: Past Medical History:  Diagnosis Date  . Diabetes mellitus without complication (HCC)    type 2  . Diabetic nephropathy (HCC)   . Diabetic retinopathy [facility]     Surgical History: Past Surgical History:  Procedure Laterality Date  . CARDIAC CATHETERIZATION N/A 01/15/2015   Procedure: Left Heart Cath and Coronary Angiography;  Surgeon: Dalia Heading, MD;  Location: [facility] INVASIVE CV LAB;  Service: Cardiovascular;  Laterality: N/A;  . CARDIAC CATHETERIZATION N/A 01/15/2015   Procedure: Coronary Stent Intervention;  Surgeon: Iran Ouch, MD;  Location: [facility] INVASIVE CV LAB;  Service: Cardiovascular;  Laterality: N/A;  . COLONOSCOPY WITH PROPOFOL N/A 04/17/2018   Procedure: COLONOSCOPY WITH PROPOFOL;  Surgeon:  Christena Deem, MD;  Location: [facility] ENDOSCOPY;  Service: Endoscopy;  Laterality: N/A;  . CORONARY ANGIOPLASTY    . CYSTECTOMY  1972  . EYE SURGERY      Home Medications:  Allergies as of 07/09/2019      Reactions   Trulicity [dulaglutide]       Medication List       Accurate as of July 09, 2019  9:26 AM. If you have any questions, ask your nurse or doctor.        aspirin EC 81 MG tablet Take 1 tablet by mouth daily.   atorvastatin 20 MG tablet Commonly known as: LIPITOR Take 1 tablet by mouth at bedtime.   brimonidine 0.2 % ophthalmic solution Commonly known as: ALPHAGAN Place 1 drop into both eyes 2 (two) times daily.   glimepiride 4 MG tablet Commonly known as: AMARYL Take 0.5 tablets by mouth daily.   HYDROcodone-acetaminophen 5-325 MG tablet Commonly known as: NORCO/VICODIN Take 1-2 tablets by mouth every 6 (six) hours as needed for moderate pain or severe pain.   Invokana 300 MG Tabs tablet Generic drug: canagliflozin Take 1 tablet by mouth daily.   latanoprost 0.005 % ophthalmic solution Commonly known as: XALATAN Place 1 drop into both eyes 2 (two) times daily.   metFORMIN 1000 MG tablet Commonly known as: GLUCOPHAGE Take 1 tablet by mouth 2 (two) times daily.   multivitamin with minerals Tabs tablet Take 1 tablet by mouth daily.   ondansetron 4 MG disintegrating tablet Commonly known as: Zofran  ODT Take 1 tablet (4 mg total) by mouth every 6 (six) hours as needed for nausea or vomiting.   pioglitazone 15 MG tablet Commonly known as: ACTOS Take 15 mg by mouth daily.   ticagrelor 90 MG Tabs tablet Commonly known as: BRILINTA Take 1 tablet (90 mg total) by mouth 2 (two) times daily.   valsartan 160 MG tablet Commonly known as: DIOVAN Take 1 tablet (160 mg total) by mouth daily.   Victoza 18 MG/3ML Sopn Generic drug: liraglutide Inject 1.8 mg into the skin at bedtime.       Allergies:  Allergies  Allergen Reactions  . Trulicity  [Dulaglutide]     Family History: No family history on file.  Social History:  reports that he has never smoked. He has never used smokeless tobacco. He reports that he does not drink alcohol or use drugs.  ROS: UROLOGY Frequent Urination?: No Hard to postpone urination?: No Burning/pain with urination?: No Get up at night to urinate?: No Leakage of urine?: No Urine stream starts and stops?: Yes Trouble starting stream?: Yes Do you have to strain to urinate?: Yes Blood in urine?: No Urinary tract infection?: No Sexually transmitted disease?: No Injury to kidneys or bladder?: No Painful intercourse?: No Weak stream?: No Erection problems?: No Penile pain?: No  Gastrointestinal Nausea?: No Vomiting?: No Indigestion/heartburn?: No Diarrhea?: No Constipation?: No  Constitutional Fever: No Night sweats?: No Weight loss?: No Fatigue?: No  Skin Skin rash/lesions?: No Itching?: No  Eyes Blurred vision?: No Double vision?: No  Ears/Nose/Throat Sore throat?: No Sinus problems?: No  Hematologic/Lymphatic Swollen glands?: No Easy bruising?: No  Cardiovascular Leg swelling?: No Chest pain?: No  Respiratory Cough?: No Shortness of breath?: No  Endocrine Excessive thirst?: No  Musculoskeletal Back pain?: No(Left Flank) Joint pain?: No  Neurological Headaches?: No Dizziness?: No  Psychologic Depression?: No Anxiety?: No  Physical Exam: BP (!) 162/94   Pulse (!) 125   Ht 5' 10" (1.778 m)   Wt 218 lb (98.9 kg)   BMI 31.28 kg/m   Constitutional:  Alert and oriented, No acute distress. HEENT: [place] AT, moist mucus membranes.  Trachea midline, no masses.  Pupils pinpoint. Cardiovascular: No clubbing, cyanosis, or edema. Respiratory: Normal respiratory effort, no increased work of breathing. GI: Abdomen is soft, nontender, nondistended, no abdominal masses GU: + Left CVA tenderness. Skin: No rashes, bruises or suspicious lesions. Neurologic: Grossly  intact, no focal deficits, moving all 4 extremities. Psychiatric: Normal mood and affect.  Laboratory Data: Lab Results  Component Value Date   WBC 8.9 07/05/2019   HGB 16.5 07/05/2019   HCT 50.5 07/05/2019   MCV 88.9 07/05/2019   PLT 243 07/05/2019    Lab Results  Component Value Date   CREATININE 1.16 07/05/2019    Urinalysis UA today unremarkable other than for glucose, see epic  Pertinent Imaging: CLINICAL DATA:  Left flank pain. Urinary frequency.  EXAM: CT ABDOMEN AND PELVIS WITH CONTRAST  TECHNIQUE: Multidetector CT imaging of the abdomen and pelvis was performed using the standard protocol following bolus administration of intravenous contrast.  CONTRAST:  100mL OMNIPAQUE IOHEXOL 300 MG/ML  SOLN  COMPARISON:  None.  FINDINGS: Lower chest: Right hemidiaphragm elevation with adjacent volume loss. Normal heart size without pericardial or pleural effusion. Multivessel coronary artery atherosclerosis.  Hepatobiliary: Too small to characterize right hepatic lobe lesion, likely a cyst. Normal gallbladder, without biliary ductal dilatation.  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  Spleen: Normal in size, without   focal abnormality.  Adrenals/Urinary Tract: Normal adrenal glands. Bilateral renal cysts and too small to characterize lesions. A stone at the left ureteropelvic junction measures 1.4 cm on 49/2. There is also a lower pole left renal collecting system 8 mm stone. Mild left-sided hydronephrosis with decreased contrast excretion on delayed images. No distal hydroureter. Normal urinary bladder.  Stomach/Bowel: Gastric antral underdistention.  Colonic stool burden suggests constipation.  Normal small bowel.  Vascular/Lymphatic: Aortic and branch vessel atherosclerosis. No abdominopelvic adenopathy.  Reproductive: Normal prostate.  Other: No significant free fluid. Tiny periumbilical fat containing  ventral abdominal wall hernia.  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  IMPRESSION: 1. Left-sided urinary tract obstruction secondary to a 1.4 cm stone at the left ureteropelvic junction. 2. Left nephrolithiasis. 3. Possible constipation. 4. Aortic Atherosclerosis (ICD10-I70.0). Coronary artery atherosclerosis.   Electronically Signed   By: Kyle  Talbot M.D.   On: 07/05/2019 15:25  Above CT scan images were personally reviewed today.  Left renal pelvic stone has Hounsfield density of approximately 1500 cc, lower pole stone 1350 Hounsfield units.  Assessment & Plan:    1. Obstruction of left ureteropelvic junction (UPJ) due to stone Large obstructing left UPJ stone along with nonobstructing 8 mm left lower pole stone  No evidence of infection  Poorly controlled pain requiring narcotics on a regular basis, unable to work.  Given the size and number of stones as well as the severe pain, would like to go ahead and decompress his left kidney and stent him within the next couple of days.  We will plan for staged procedure to stone down the road once his ureter has had time to dilate.  He is agreeable this plan.  Alternatives including shockwave lithotripsy was also discussed.  Given the fairly large size of the stone and density, efficacy rate would be significantly lower for this option.  Risks and benefits of ureteroscopy/ stent were reviewed including but not limited to infection, bleeding, pain, ureteral injury which could require open surgery versus prolonged indwelling if ureteral perforation occurs, persistent stone disease, requirement for staged procedure, possible stent, and global anesthesia risks. Patient expressed understanding and desires to proceed with ureteroscopy.  - Urinalysis, Complete - CULTURE, URINE COMPREHENSIVE  2. Left flank pain Vicodin refill x10  3. Drug-induced constipation Strongly recommend stopping by pharmacy to pick up MiraLAX  as well as stool softener twice daily for maintenance   Sydne Krahl, MD  [facility] [address], [address] [place], [place] [address] [phone]  

## 2019-07-09 NOTE — Progress Notes (Signed)
07/09/2019 9:26 AM   Jason Harrison 05/10/1951 [id number]  Referring provider: Barbette Reichmann, MD [address] [facility] [place],  [place] [address]  Chief Complaint  Patient presents with  . Nephrolithiasis    HPI: 69 year old male with left flank pain who presents today to establish care for a large left UPJ stone.  Notably he was seen and evaluated emergency room on 07/05/2019 with nausea vomiting and left flank pain.  He was found to have a large 1.4 cm left UPJ stone along with a 8 mm left lower pole stone.  He had mild left hydroureteronephrosis with some surrounding perinephric stranding.  Hounsfield units approximately 1500.  Lab work revealed normal WBC and creatinine.  UA was also unremarkable.  He did have glucose in his urine.  He continues to have intermittent left flank pain which is fairly severe.  He is having to take narcotics on a regular basis.  He also mentions today that he is very constipated has not had a bowel movement since the first.  He is not taking anything for this and is starting to feel distended and uncomfortable.  No fevers or chills.  No dysuria or gross hematuria.  No previous history of kidney stones.   PMH: Past Medical History:  Diagnosis Date  . Diabetes mellitus without complication (HCC)    type 2  . Diabetic nephropathy (HCC)   . Diabetic retinopathy [facility]     Surgical History: Past Surgical History:  Procedure Laterality Date  . CARDIAC CATHETERIZATION N/A 01/15/2015   Procedure: Left Heart Cath and Coronary Angiography;  Surgeon: Dalia Heading, MD;  Location: [facility] INVASIVE CV LAB;  Service: Cardiovascular;  Laterality: N/A;  . CARDIAC CATHETERIZATION N/A 01/15/2015   Procedure: Coronary Stent Intervention;  Surgeon: Iran Ouch, MD;  Location: [facility] INVASIVE CV LAB;  Service: Cardiovascular;  Laterality: N/A;  . COLONOSCOPY WITH PROPOFOL N/A 04/17/2018   Procedure: COLONOSCOPY WITH PROPOFOL;  Surgeon:  Christena Deem, MD;  Location: [facility] ENDOSCOPY;  Service: Endoscopy;  Laterality: N/A;  . CORONARY ANGIOPLASTY    . CYSTECTOMY  1972  . EYE SURGERY      Home Medications:  Allergies as of 07/09/2019      Reactions   Trulicity [dulaglutide]       Medication List       Accurate as of July 09, 2019  9:26 AM. If you have any questions, ask your nurse or doctor.        aspirin EC 81 MG tablet Take 1 tablet by mouth daily.   atorvastatin 20 MG tablet Commonly known as: LIPITOR Take 1 tablet by mouth at bedtime.   brimonidine 0.2 % ophthalmic solution Commonly known as: ALPHAGAN Place 1 drop into both eyes 2 (two) times daily.   glimepiride 4 MG tablet Commonly known as: AMARYL Take 0.5 tablets by mouth daily.   HYDROcodone-acetaminophen 5-325 MG tablet Commonly known as: NORCO/VICODIN Take 1-2 tablets by mouth every 6 (six) hours as needed for moderate pain or severe pain.   Invokana 300 MG Tabs tablet Generic drug: canagliflozin Take 1 tablet by mouth daily.   latanoprost 0.005 % ophthalmic solution Commonly known as: XALATAN Place 1 drop into both eyes 2 (two) times daily.   metFORMIN 1000 MG tablet Commonly known as: GLUCOPHAGE Take 1 tablet by mouth 2 (two) times daily.   multivitamin with minerals Tabs tablet Take 1 tablet by mouth daily.   ondansetron 4 MG disintegrating tablet Commonly known as: Zofran  ODT Take 1 tablet (4 mg total) by mouth every 6 (six) hours as needed for nausea or vomiting.   pioglitazone 15 MG tablet Commonly known as: ACTOS Take 15 mg by mouth daily.   ticagrelor 90 MG Tabs tablet Commonly known as: BRILINTA Take 1 tablet (90 mg total) by mouth 2 (two) times daily.   valsartan 160 MG tablet Commonly known as: DIOVAN Take 1 tablet (160 mg total) by mouth daily.   Victoza 18 MG/3ML Sopn Generic drug: liraglutide Inject 1.8 mg into the skin at bedtime.       Allergies:  Allergies  Allergen Reactions  . Trulicity  [Dulaglutide]     Family History: No family history on file.  Social History:  reports that he has never smoked. He has never used smokeless tobacco. He reports that he does not drink alcohol or use drugs.  ROS: UROLOGY Frequent Urination?: No Hard to postpone urination?: No Burning/pain with urination?: No Get up at night to urinate?: No Leakage of urine?: No Urine stream starts and stops?: Yes Trouble starting stream?: Yes Do you have to strain to urinate?: Yes Blood in urine?: No Urinary tract infection?: No Sexually transmitted disease?: No Injury to kidneys or bladder?: No Painful intercourse?: No Weak stream?: No Erection problems?: No Penile pain?: No  Gastrointestinal Nausea?: No Vomiting?: No Indigestion/heartburn?: No Diarrhea?: No Constipation?: No  Constitutional Fever: No Night sweats?: No Weight loss?: No Fatigue?: No  Skin Skin rash/lesions?: No Itching?: No  Eyes Blurred vision?: No Double vision?: No  Ears/Nose/Throat Sore throat?: No Sinus problems?: No  Hematologic/Lymphatic Swollen glands?: No Easy bruising?: No  Cardiovascular Leg swelling?: No Chest pain?: No  Respiratory Cough?: No Shortness of breath?: No  Endocrine Excessive thirst?: No  Musculoskeletal Back pain?: No(Left Flank) Joint pain?: No  Neurological Headaches?: No Dizziness?: No  Psychologic Depression?: No Anxiety?: No  Physical Exam: BP (!) 162/94   Pulse (!) 125   Ht 5\' 10"  (1.778 m)   Wt 218 lb (98.9 kg)   BMI 31.28 kg/m   Constitutional:  Alert and oriented, No acute distress. HEENT: [place] AT, moist mucus membranes.  Trachea midline, no masses.  Pupils pinpoint. Cardiovascular: No clubbing, cyanosis, or edema. Respiratory: Normal respiratory effort, no increased work of breathing. GI: Abdomen is soft, nontender, nondistended, no abdominal masses GU: + Left CVA tenderness. Skin: No rashes, bruises or suspicious lesions. Neurologic: Grossly  intact, no focal deficits, moving all 4 extremities. Psychiatric: Normal mood and affect.  Laboratory Data: Lab Results  Component Value Date   WBC 8.9 07/05/2019   HGB 16.5 07/05/2019   HCT 50.5 07/05/2019   MCV 88.9 07/05/2019   PLT 243 07/05/2019    Lab Results  Component Value Date   CREATININE 1.16 07/05/2019    Urinalysis UA today unremarkable other than for glucose, see epic  Pertinent Imaging: CLINICAL DATA:  Left flank pain. Urinary frequency.  EXAM: CT ABDOMEN AND PELVIS WITH CONTRAST  TECHNIQUE: Multidetector CT imaging of the abdomen and pelvis was performed using the standard protocol following bolus administration of intravenous contrast.  CONTRAST:  09/02/2019 OMNIPAQUE IOHEXOL 300 MG/ML  SOLN  COMPARISON:  None.  FINDINGS: Lower chest: Right hemidiaphragm elevation with adjacent volume loss. Normal heart size without pericardial or pleural effusion. Multivessel coronary artery atherosclerosis.  Hepatobiliary: Too small to characterize right hepatic lobe lesion, likely a cyst. Normal gallbladder, without biliary ductal dilatation.  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  Spleen: Normal in size, without  focal abnormality.  Adrenals/Urinary Tract: Normal adrenal glands. Bilateral renal cysts and too small to characterize lesions. A stone at the left ureteropelvic junction measures 1.4 cm on 49/2. There is also a lower pole left renal collecting system 8 mm stone. Mild left-sided hydronephrosis with decreased contrast excretion on delayed images. No distal hydroureter. Normal urinary bladder.  Stomach/Bowel: Gastric antral underdistention.  Colonic stool burden suggests constipation.  Normal small bowel.  Vascular/Lymphatic: Aortic and branch vessel atherosclerosis. No abdominopelvic adenopathy.  Reproductive: Normal prostate.  Other: No significant free fluid. Tiny periumbilical fat containing  ventral abdominal wall hernia.  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  IMPRESSION: 1. Left-sided urinary tract obstruction secondary to a 1.4 cm stone at the left ureteropelvic junction. 2. Left nephrolithiasis. 3. Possible constipation. 4. Aortic Atherosclerosis (ICD10-I70.0). Coronary artery atherosclerosis.   Electronically Signed   By: Abigail Miyamoto M.D.   On: 07/05/2019 15:25  Above CT scan images were personally reviewed today.  Left renal pelvic stone has Hounsfield density of approximately 1500 cc, lower pole stone 1350 Hounsfield units.  Assessment & Plan:    1. Obstruction of left ureteropelvic junction (UPJ) due to stone Large obstructing left UPJ stone along with nonobstructing 8 mm left lower pole stone  No evidence of infection  Poorly controlled pain requiring narcotics on a regular basis, unable to work.  Given the size and number of stones as well as the severe pain, would like to go ahead and decompress his left kidney and stent him within the next couple of days.  We will plan for staged procedure to stone down the road once his ureter has had time to dilate.  He is agreeable this plan.  Alternatives including shockwave lithotripsy was also discussed.  Given the fairly large size of the stone and density, efficacy rate would be significantly lower for this option.  Risks and benefits of ureteroscopy/ stent were reviewed including but not limited to infection, bleeding, pain, ureteral injury which could require open surgery versus prolonged indwelling if ureteral perforation occurs, persistent stone disease, requirement for staged procedure, possible stent, and global anesthesia risks. Patient expressed understanding and desires to proceed with ureteroscopy.  - Urinalysis, Complete - CULTURE, URINE COMPREHENSIVE  2. Left flank pain Vicodin refill x10  3. Drug-induced constipation Strongly recommend stopping by pharmacy to pick up MiraLAX  as well as stool softener twice daily for maintenance   Hollice Espy, MD  [facility] [address] [place], [place] [address] [phone]

## 2019-07-11 ENCOUNTER — Ambulatory Visit: Payer: Medicare Other

## 2019-07-11 ENCOUNTER — Ambulatory Visit: Payer: Medicare Other | Admitting: Certified Registered"

## 2019-07-11 ENCOUNTER — Encounter
Admission: RE | Disposition: A | Discharge status: Home / Self Care | Payer: Self-pay | Source: Home / Self Care | Attending: Urology

## 2019-07-11 ENCOUNTER — Other Ambulatory Visit: Payer: Self-pay

## 2019-07-11 ENCOUNTER — Encounter: Payer: Self-pay | Admitting: Urology

## 2019-07-11 ENCOUNTER — Ambulatory Visit
Admission: RE | Admit: 2019-07-11 | Discharge: 2019-07-11 | Disposition: A | Discharge status: Home / Self Care | Payer: Medicare Other | Attending: Urology | Admitting: Urology

## 2019-07-11 DIAGNOSIS — Z7984 Long term (current) use of oral hypoglycemic drugs: Secondary | ICD-10-CM | POA: Insufficient documentation

## 2019-07-11 DIAGNOSIS — Z7982 Long term (current) use of aspirin: Secondary | ICD-10-CM | POA: Diagnosis not present

## 2019-07-11 DIAGNOSIS — Z955 Presence of coronary angioplasty implant and graft: Secondary | ICD-10-CM | POA: Insufficient documentation

## 2019-07-11 DIAGNOSIS — Z79899 Other long term (current) drug therapy: Secondary | ICD-10-CM | POA: Insufficient documentation

## 2019-07-11 DIAGNOSIS — I10 Essential (primary) hypertension: Secondary | ICD-10-CM | POA: Insufficient documentation

## 2019-07-11 DIAGNOSIS — E11319 Type 2 diabetes mellitus with unspecified diabetic retinopathy without macular edema: Secondary | ICD-10-CM | POA: Diagnosis not present

## 2019-07-11 DIAGNOSIS — N201 Calculus of ureter: Secondary | ICD-10-CM

## 2019-07-11 DIAGNOSIS — N132 Hydronephrosis with renal and ureteral calculous obstruction: Secondary | ICD-10-CM | POA: Diagnosis present

## 2019-07-11 DIAGNOSIS — K5903 Drug induced constipation: Secondary | ICD-10-CM | POA: Insufficient documentation

## 2019-07-11 DIAGNOSIS — E1121 Type 2 diabetes mellitus with diabetic nephropathy: Secondary | ICD-10-CM | POA: Diagnosis not present

## 2019-07-11 DIAGNOSIS — Z419 Encounter for procedure for purposes other than remedying health state, unspecified: Secondary | ICD-10-CM

## 2019-07-11 HISTORY — PX: CYSTOSCOPY WITH STENT PLACEMENT: SHX5790

## 2019-07-11 LAB — GLUCOSE, CAPILLARY
Glucose-Capillary: 221 mg/dL — ABNORMAL HIGH (ref 70–99)
Glucose-Capillary: 188 mg/dL — ABNORMAL HIGH (ref 70–99)

## 2019-07-11 LAB — CULTURE, URINE COMPREHENSIVE

## 2019-07-11 SURGERY — CYSTOSCOPY, WITH STENT INSERTION
Anesthesia: General | Laterality: Left

## 2019-07-11 MED ORDER — GLYCOPYRROLATE 0.2 MG/ML IJ SOLN
INTRAMUSCULAR | Status: DC | PRN
  Administered 2019-07-11: .2 mg via INTRAVENOUS

## 2019-07-11 MED ORDER — DEXAMETHASONE SODIUM PHOSPHATE 10 MG/ML IJ SOLN
INTRAMUSCULAR | Status: AC
  Filled 2019-07-11: qty 1

## 2019-07-11 MED ORDER — MIDAZOLAM HCL 2 MG/2ML IJ SOLN
INTRAMUSCULAR | Status: DC | PRN
  Administered 2019-07-11: 2 mg via INTRAVENOUS

## 2019-07-11 MED ORDER — EPHEDRINE SULFATE 50 MG/ML IJ SOLN
INTRAMUSCULAR | Status: AC
  Filled 2019-07-11: qty 1

## 2019-07-11 MED ORDER — CEFAZOLIN SODIUM-DEXTROSE 2-4 GM/100ML-% IV SOLN
INTRAVENOUS | Status: AC
  Filled 2019-07-11: qty 100

## 2019-07-11 MED ORDER — OXYBUTYNIN CHLORIDE 5 MG PO TABS: 5.0000 mg | ORAL_TABLET | Freq: Three times a day (TID) | ORAL | 0 refills | Status: DC | PRN

## 2019-07-11 MED ORDER — FAMOTIDINE 20 MG PO TABS: 20.0000 mg | ORAL_TABLET | Freq: Once | ORAL | Status: AC

## 2019-07-11 MED ORDER — GLYCOPYRROLATE 0.2 MG/ML IJ SOLN
INTRAMUSCULAR | Status: AC
  Filled 2019-07-11: qty 1

## 2019-07-11 MED ORDER — FAMOTIDINE 20 MG PO TABS
ORAL_TABLET | ORAL | Status: AC
  Administered 2019-07-11: 20 mg via ORAL
  Filled 2019-07-11: qty 1

## 2019-07-11 MED ORDER — PROPOFOL 10 MG/ML IV BOLUS
INTRAVENOUS | Status: DC | PRN
  Administered 2019-07-11: 150 mg via INTRAVENOUS

## 2019-07-11 MED ORDER — TAMSULOSIN HCL 0.4 MG PO CAPS: 0.4000 mg | ORAL_CAPSULE | Freq: Every day | ORAL | 0 refills | Status: DC

## 2019-07-11 MED ORDER — FENTANYL CITRATE (PF) 100 MCG/2ML IJ SOLN
INTRAMUSCULAR | Status: DC | PRN
  Administered 2019-07-11 (×2): 25 ug via INTRAVENOUS

## 2019-07-11 MED ORDER — FENTANYL CITRATE (PF) 100 MCG/2ML IJ SOLN: 25.0000 ug | INTRAMUSCULAR | Status: DC | PRN

## 2019-07-11 MED ORDER — LIDOCAINE HCL (CARDIAC) PF 100 MG/5ML IV SOSY
PREFILLED_SYRINGE | INTRAVENOUS | Status: DC | PRN
  Administered 2019-07-11: 100 mg via INTRAVENOUS

## 2019-07-11 MED ORDER — PHENYLEPHRINE HCL (PRESSORS) 10 MG/ML IV SOLN
INTRAVENOUS | Status: DC | PRN
  Administered 2019-07-11: 200 ug via INTRAVENOUS

## 2019-07-11 MED ORDER — ONDANSETRON HCL 4 MG/2ML IJ SOLN
INTRAMUSCULAR | Status: DC | PRN
  Administered 2019-07-11: 4 mg via INTRAVENOUS

## 2019-07-11 MED ORDER — MIDAZOLAM HCL 2 MG/2ML IJ SOLN
INTRAMUSCULAR | Status: AC
  Filled 2019-07-11: qty 2

## 2019-07-11 MED ORDER — FENTANYL CITRATE (PF) 100 MCG/2ML IJ SOLN
INTRAMUSCULAR | Status: AC
  Filled 2019-07-11: qty 2

## 2019-07-11 MED ORDER — ONDANSETRON HCL 4 MG/2ML IJ SOLN: 4.0000 mg | Freq: Once | INTRAMUSCULAR | Status: DC | PRN

## 2019-07-11 MED ORDER — ONDANSETRON HCL 4 MG/2ML IJ SOLN
INTRAMUSCULAR | Status: AC
  Filled 2019-07-11: qty 2

## 2019-07-11 MED ORDER — DEXAMETHASONE SODIUM PHOSPHATE 10 MG/ML IJ SOLN
INTRAMUSCULAR | Status: DC | PRN
  Administered 2019-07-11: 10 mg via INTRAVENOUS

## 2019-07-11 MED ORDER — SODIUM CHLORIDE 0.9 % IV SOLN: INTRAVENOUS | Status: DC

## 2019-07-11 MED ORDER — CEFAZOLIN SODIUM-DEXTROSE 2-4 GM/100ML-% IV SOLN
2.0000 g | INTRAVENOUS | Status: AC
  Administered 2019-07-11: 2 g via INTRAVENOUS

## 2019-07-11 SURGICAL SUPPLY — 18 items
BAG DRAIN CYSTO-URO LG1000N (MISCELLANEOUS) ×3 IMPLANT
BRUSH SCRUB EZ 1% IODOPHOR (MISCELLANEOUS) ×3 IMPLANT
CATH URETL 5X70 OPEN END (CATHETERS) ×3 IMPLANT
GLOVE BIO SURGEON STRL SZ 6.5 (GLOVE) ×2 IMPLANT
GLOVE BIO SURGEONS STRL SZ 6.5 (GLOVE) ×1
GOWN STRL REUS W/ TWL LRG LVL3 (GOWN DISPOSABLE) ×2 IMPLANT
GOWN STRL REUS W/TWL LRG LVL3 (GOWN DISPOSABLE) ×4
GUIDEWIRE STR DUAL SENSOR (WIRE) ×3 IMPLANT
KIT TURNOVER CYSTO (KITS) ×3 IMPLANT
PACK CYSTO AR (MISCELLANEOUS) ×3 IMPLANT
SET CYSTO W/LG BORE CLAMP LF (SET/KITS/TRAYS/PACK) ×3 IMPLANT
SOL .9 NS 3000ML IRR  AL (IV SOLUTION) ×2
SOL .9 NS 3000ML IRR UROMATIC (IV SOLUTION) ×1 IMPLANT
STENT URET 6FRX24 CONTOUR (STENTS) IMPLANT
STENT URET 6FRX26 CONTOUR (STENTS) ×3 IMPLANT
SURGILUBE 2OZ TUBE FLIPTOP (MISCELLANEOUS) ×3 IMPLANT
SYRINGE IRR TOOMEY STRL 70CC (SYRINGE) ×3 IMPLANT
WATER STERILE IRR 1000ML POUR (IV SOLUTION) ×3 IMPLANT

## 2019-07-11 NOTE — Discharge Instructions (Signed)
What can I expect after the procedure? After the procedure, it is common to have: 1. Some soreness or pain in your abdomen and urethra. 2. Urinary symptoms. These include: ? Mild pain or burning when you urinate. Pain should stop within a few minutes after you urinate. This may last for up to 1 week. ? A small amount of blood in your urine for several days. ? Feeling like you need to urinate but producing only a small amount of urine. Follow these instructions at home: Medicines  Take over-the-counter and prescription medicines only as told by your health care provider.  If you were prescribed an antibiotic medicine, take it as told by your health care provider. Do not stop taking the antibiotic even if you start to feel better. General instructions  Return to your normal activities as told by your health care provider. Ask your health care provider what activities are safe for you.  Do not drive for 24 hours if you were given a sedative during your procedure.  Watch for any blood in your urine. If the amount of blood in your urine increases, call your health care provider.  Follow instructions from your health care provider about eating or drinking restrictions.  If a tissue sample was removed for testing (biopsy) during your procedure, it is up to you to get your test results. Ask your health care provider, or the department that is doing the test, when your results will be ready.  Drink enough fluid to keep your urine pale yellow.  Keep all follow-up visits as told by your health care provider. This is important. Contact a health care provider if you:  Have pain that gets worse or does not get better with medicine, especially pain when you urinate.  Have trouble urinating.  Have more blood in your urine. Get help right away if you:  Have blood clots in your urine.  Have abdominal pain.  Have a fever or chills.  Are unable to urinate.      AMBULATORY SURGERY    DISCHARGE INSTRUCTIONS   1) The drugs that you were given will stay in your system until tomorrow so for the next 24 hours you should not:  A) Drive an automobile B) Make any legal decisions C) Drink any alcoholic beverage   2) You may resume regular meals tomorrow.  Today it is better to start with liquids and gradually work up to solid foods.  You may eat anything you prefer, but it is better to start with liquids, then soup and crackers, and gradually work up to solid foods.   3) Please notify your doctor immediately if you have any unusual bleeding, trouble breathing, redness and pain at the surgery site, drainage, fever, or pain not relieved by medication.    4) Additional Instructions:        Please contact your physician with any problems or Same Day Surgery at [phone], Monday through Friday 6 am to 4 pm, or [place] at [facility] number at [phone].

## 2019-07-11 NOTE — Interval H&P Note (Signed)
History and Physical Interval Note:  07/11/2019 8:08 AM  Jason Harrison  has presented today for surgery, with the diagnosis of left ureteral stone.  The various methods of treatment have been discussed with the patient and family. After consideration of risks, benefits and other options for treatment, the patient has consented to  Procedure(s): CYSTOSCOPY WITH STENT PLACEMENT (Left) as a surgical intervention.  The patient's history has been reviewed, patient examined, no change in status, stable for surgery.  I have reviewed the patient's chart and labs.  Questions were answered to the patient's satisfaction.    RRR CTAB  Vanna Scotland

## 2019-07-11 NOTE — Transfer of Care (Signed)
Immediate Anesthesia Transfer of Care Note  Patient: Jason Harrison  Procedure(s) Performed: CYSTOSCOPY WITH STENT PLACEMENT (Left )  Patient Location: PACU  Anesthesia Type:General  Level of Consciousness: awake, patient cooperative and responds to stimulation  Airway & Oxygen Therapy: Patient Spontanous Breathing and Patient connected to face mask oxygen  Post-op Assessment: Report given to RN and Post -op Vital signs reviewed and stable  Post vital signs: Reviewed and stable  Last Vitals:  Vitals Value Taken Time  BP 120/80 07/11/19 0939  Temp 36.6 C 07/11/19 0939  Pulse 100 07/11/19 0941  Resp 16 07/11/19 0941  SpO2 96 % 07/11/19 0941  Vitals shown include unvalidated device data.  Last Pain:  Vitals:   07/11/19 0939  TempSrc:   PainSc: Asleep         Complications: No apparent anesthesia complications

## 2019-07-11 NOTE — Op Note (Signed)
Date of procedure: 07/11/19  Preoperative diagnosis:  1. Left UPJ stone  Postoperative diagnosis:  1. Same as above  Procedure: 1. Left retrograde pyelogram 2. Left ureteral stent placement  Surgeon: Vanna Scotland, MD  Anesthesia: General  Complications: None  Intraoperative findings: Mild to moderate hydroureteronephrosis with filling defect in the renal pelvis with location of known stone.  Stent placed without difficulty.  EBL: Minimal  Specimens: None  Drains: 6 x 26  French double-J ureteral stent on left  Indication: Jason Harrison is a 69 y.o. patient with large UPJ stone and poorly controlled pain.  He will undergo stent placement today with plans for staged procedure in the near future. after reviewing the management options for treatment, he elected to proceed with the above surgical procedure(s). We have discussed the potential benefits and risks of the procedure, side effects of the proposed treatment, the likelihood of the patient achieving the goals of the procedure, and any potential problems that might occur during the procedure or recuperation. Informed consent has been obtained.  Description of procedure:  The patient was taken to the operating room and general anesthesia was induced.  The patient was placed in the dorsal lithotomy position, prepped and draped in the usual sterile fashion, and preoperative antibiotics were administered. A preoperative time-out was performed.   A 21 French scope was advanced per urethra into the bladder.  Attention was turned to the left ureteral orifice.  This was intubated using a 5 [place] open-ended ureteral catheter.  Gentle retrograde pyelogram was performed which revealed mild to moderate hydroureteronephrosis on the final defect within the renal pelvis.  A wire was then placed up to the level of the kidney.  A 6 x 26 French double-J ureteral stent was advanced easily to the level the renal pelvis.  The wires partially drawn  until full coils noted both within the renal pelvis and ultimately fully withdrawn full coils noted in the bladder.  Bladder was drained and the scope was removed.  The patient was cleaned and dried, repositioned in supine position, reversed myesthesia, taken the PACU in stable condition.  Plan: Patient will return in approximately 10 days for staged ureteroscopic procedure to treat his stone.  Vanna Scotland, M.D.

## 2019-07-11 NOTE — Anesthesia Procedure Notes (Signed)
Procedure Name: LMA Insertion Performed by: Fletcher-Harrison, Aadhav Uhlig, CRNA Pre-anesthesia Checklist: Patient identified, Emergency Drugs available, Suction available and Patient being monitored Patient Re-evaluated:Patient Re-evaluated prior to induction Oxygen Delivery Method: Circle system utilized Preoxygenation: Pre-oxygenation with 100% oxygen Induction Type: IV induction Ventilation: Mask ventilation without difficulty LMA: LMA inserted LMA Size: 4.0 Number of attempts: 1 Placement Confirmation: positive ETCO2,  breath sounds checked- equal and bilateral and CO2 detector Tube secured with: Tape Dental Injury: Teeth and Oropharynx as per pre-operative assessment        

## 2019-07-11 NOTE — Anesthesia Preprocedure Evaluation (Signed)
Anesthesia Evaluation  Patient identified by MRN, date of birth, ID band Patient awake    Reviewed: Allergy & Precautions, H&P , NPO status , Patient's Chart, lab work & pertinent test results  History of Anesthesia Complications Negative for: history of anesthetic complications  Airway Mallampati: III  TM Distance: <3 FB Neck ROM: limited    Dental  (+) Chipped, Poor Dentition, Missing   Pulmonary neg pulmonary ROS, neg shortness of breath,           Cardiovascular Exercise Tolerance: Good hypertension, (-) angina+ CAD, + Past MI and + Cardiac Stents  (-) CABG and (-) DOE (-) dysrhythmias (-) Valvular Problems/Murmurs     Neuro/Psych negative neurological ROS  negative psych ROS   GI/Hepatic negative GI ROS, Neg liver ROS, neg GERD  ,  Endo/Other  diabetes, Type 2  Renal/GU Renal disease  negative genitourinary   Musculoskeletal   Abdominal   Peds  Hematology negative hematology ROS (+)   Anesthesia Other Findings Past Medical History: No date: Diabetes mellitus without complication (HCC)     Comment:  type 2 No date: Diabetic nephropathy (HCC) No date: Diabetic retinopathy (HCC) No date: Hypertension  Past Surgical History: 01/15/2015: CARDIAC CATHETERIZATION; N/A     Comment:  Procedure: Left Heart Cath and Coronary Angiography;                Surgeon: Dalia Heading, MD;  Location: [facility] INVASIVE CV               LAB;  Service: Cardiovascular;  Laterality: N/A; 01/15/2015: CARDIAC CATHETERIZATION; N/A     Comment:  Procedure: Coronary Stent Intervention;  Surgeon:               Iran Ouch, MD;  Location: [facility] INVASIVE CV LAB;                Service: Cardiovascular;  Laterality: N/A; No date: CORONARY ANGIOPLASTY 1972: CYSTECTOMY No date: EYE SURGERY  BMI    Body Mass Index:  29.56 kg/m      Reproductive/Obstetrics negative OB ROS                              Anesthesia Physical  Anesthesia Plan  ASA: III  Anesthesia Plan: General   Post-op Pain Management:    Induction: Intravenous  PONV Risk Score and Plan: 2 and Propofol infusion and TIVA  Airway Management Planned: LMA  Additional Equipment:   Intra-op Plan:   Post-operative Plan: Extubation in OR  Informed Consent: I have reviewed the patients History and Physical, chart, labs and discussed the procedure including the risks, benefits and alternatives for the proposed anesthesia with the patient or authorized representative who has indicated his/her understanding and acceptance.     Dental Advisory Given  Plan Discussed with: Anesthesiologist, CRNA and Surgeon  Anesthesia Plan Comments: (Patient consented for risks of anesthesia including but not limited to:  - adverse reactions to medications - risk of intubation if required - damage to teeth, lips or other oral mucosa - sore throat or hoarseness - Damage to heart, brain, lungs or loss of life  Patient voiced understanding.)        Anesthesia Quick Evaluation

## 2019-07-12 ENCOUNTER — Other Ambulatory Visit: Payer: Self-pay | Admitting: *Deleted

## 2019-07-12 DIAGNOSIS — N201 Calculus of ureter: Secondary | ICD-10-CM

## 2019-07-12 NOTE — Anesthesia Postprocedure Evaluation (Signed)
Anesthesia Post Note  Patient: Jason Harrison  Procedure(s) Performed: CYSTOSCOPY WITH STENT PLACEMENT (Left )  Patient location during evaluation: PACU Anesthesia Type: General Level of consciousness: awake and alert Pain management: pain level controlled Vital Signs Assessment: post-procedure vital signs reviewed and stable Respiratory status: spontaneous breathing, nonlabored ventilation, respiratory function stable and patient connected to nasal cannula oxygen Cardiovascular status: blood pressure returned to baseline and stable Postop Assessment: no apparent nausea or vomiting Anesthetic complications: no     Last Vitals:  Vitals:   07/11/19 1056 07/11/19 1134  BP: (!) 138/95 (!) 156/93  Pulse: (!) 102 (!) 108  Resp: 18 18  Temp:    SpO2: 99% 98%    Last Pain:  Vitals:   07/11/19 1134  TempSrc:   PainSc: 0-No pain                 Lenard Simmer

## 2019-07-15 ENCOUNTER — Other Ambulatory Visit: Payer: Medicare Other

## 2019-07-15 ENCOUNTER — Other Ambulatory Visit: Payer: Self-pay

## 2019-07-15 DIAGNOSIS — N201 Calculus of ureter: Secondary | ICD-10-CM

## 2019-07-15 LAB — URINALYSIS, COMPLETE
Bilirubin, UA: NEGATIVE
Ketones, UA: NEGATIVE
Leukocytes,UA: NEGATIVE
Nitrite, UA: NEGATIVE
Specific Gravity, UA: 1.015 (ref 1.005–1.030)
Urobilinogen, Ur: 0.2 mg/dL (ref 0.2–1.0)
pH, UA: 5.5 (ref 5.0–7.5)

## 2019-07-15 LAB — MICROSCOPIC EXAMINATION
Bacteria, UA: NONE SEEN
RBC, Urine: >30 /hpf — AB (ref 0–2)

## 2019-07-17 ENCOUNTER — Encounter
Admission: RE | Admit: 2019-07-17 | Discharge: 2019-07-17 | Disposition: A | Discharge status: Home / Self Care | Payer: Medicare Other | Source: Ambulatory Visit | Attending: Urology | Admitting: Urology

## 2019-07-17 LAB — CULTURE, URINE COMPREHENSIVE

## 2019-07-17 NOTE — Patient Instructions (Addendum)
Your procedure is scheduled on: 07-22-19 [facility] Report to Same Day Surgery 2nd floor medical mall [facility] Entrance-take elevator on left to 2nd floor.  Check in with surgery information desk.) To find out your arrival time please call [phone] between 1PM - 3PM on 07-19-19 FRIDAY  Remember: Instructions that are not followed completely may result in serious medical risk, up to and including death, or upon the discretion of your surgeon and anesthesiologist your surgery may need to be rescheduled.    _x___ 1. Do not eat food after midnight the night before your procedure. NO GUM OR CANDY AFTER MIDNIGHT. You may drink WATER up to 2 hours before you are scheduled to arrive at the hospital for your procedure.  Do not drink WATER  within 2 hours of your scheduled arrival to the hospital.  Type 1 and type 2 diabetics should only drink water.   ____Ensure clear carbohydrate drink on the way to the hospital for bariatric patients  ____Ensure clear carbohydrate drink 3 hours before surgery.    __x__ 2. No Alcohol for 24 hours before or after surgery.   __x__3. No Smoking or e-cigarettes for 24 prior to surgery.  Do not use any chewable tobacco products for at least 6 hour prior to surgery   ____  4. Bring all medications with you on the day of surgery if instructed.    __x__ 5. Notify your doctor if there is any change in your medical condition     (cold, fever, infections).    x___6. On the morning of surgery brush your teeth with toothpaste and water.  You may rinse your mouth with mouth wash if you wish.  Do not swallow any toothpaste or mouthwash.   Do not wear jewelry, make-up, hairpins, clips or nail polish.  Do not wear lotions, powders, or perfumes. You may wear deodorant.  Do not shave 48 hours prior to surgery. Men may shave face and neck.  Do not bring valuables to the hospital.    [facility] is not responsible for any belongings or valuables.               Contacts,  dentures or bridgework may not be worn into surgery.  Leave your suitcase in the car. After surgery it may be brought to your room.  For patients admitted to the hospital, discharge time is determined by your treatment team.  _  Patients discharged the day of surgery will not be allowed to drive home.  You will need someone to drive you home and stay with you the night of your procedure.    Please read over the following fact sheets that you were given:   [facility] Preparing for Surgery   _x___ TAKE THE FOLLOWING MEDICATION THE MORNING OF SURGERY WITH A SMALL SIP OF WATER. These include:  1. ZYRTEC (CETIRIZINE)  2. FLOMAX (TAMSULOSIN)   3. DITROPAN (OXYBUTYNIN)  4.  5.  6.  ____Fleets enema or Magnesium Citrate as directed.   ____ Use CHG Soap or sage wipes as directed on instruction sheet   ____ Use inhalers on the day of surgery and bring to hospital day of surgery  _X___ Stop Metformin 2 days prior to surgery-LAST DOSE ON FRIDAY (07-19-19)    ____ Take 1/2 of usual insulin dose the night before surgery and none on the morning surgery.   _x___ Follow recommendations from Cardiologist, Pulmonologist or PCP regarding stopping Aspirin, Coumadin, Plavix ,Eliquis, Effient, or Pradaxa, and Pletal-CALL DR Delana Meyer  OFFICE ABOUT STOPPING YOUR ASPIRIN  X____Stop Anti-inflammatories such as Advil, Aleve, Ibuprofen, Motrin, Naproxen, Naprosyn, Goodies powders or aspirin products NOW-OK to take Tylenol OR HYDROCODONE IF NEEDED   _x___ Stop supplements until after surgery-STOP ALPHA LIPOIC ACID, FISH OIL, BIOTIN, CHROMIUM, MELATONIN, PRESERVISION, TURMERIC, AND SAFFRON NOW-MAY RESUME AFTER SURGERY   ____ Bring C-Pap to the hospital.

## 2019-07-17 NOTE — Pre-Procedure Instructions (Signed)
Progress Notes - documented in this encounter Matilde Haymaker, PA - 01/28/2019 9:15 AM EDT Formatting of this note might be different from the original. Established Patient Visit   Chief Complaint: Chief Complaint  Patient presents with  . 6 month follow up  no complaints  Date of Service: 01/28/2019 Date of Birth: [birth year] PCP: Alan Mulder, MD  History of Present Illness: Jason Harrison is a 69 y.o.male patient with a past medical history significant for coronary artery disease S/P PCI in his LAD in 2016, hypertension,hyperlipidemia, and type 2 diabetes who presents for a follow up visit. States that he is doing well from a cardiac standpoint and denies chest pain, palpitations, shortness of breath, lower extremity swelling, orthopnea or PND. Admits to occasional dizziness/lightheadedness after being in the heat for prolonged periods of time, but symptoms resolve after rest/eating. No evidence of syncopal or presyncopal episodes. Of note, he is also trying to lose weight through intermittent fasting, and is fasting for up to 16 hours at a time.   Echocardiogram on 02/20/2018 revealed normal LV function with mild MR and trivial TR. Stress test revealed an EF of 49% with a fixed apical defect with no evidence of reversible ischemia.  Past Medical and Surgical History  Past Medical History Past Medical History:  Diagnosis Date  . Cataract cortical, senile  . Controlled type 2 diabetes mellitus with proteinuria or albuminuria 03/24/2014  . Diabetic nephropathy (CMS-HCC)  . Diabetic retinopathy (CMS-HCC)  . Dyslipidemia  . ED (erectile dysfunction)  . Essential hypertension, benign  . 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 MI (myocardial infarction) (CMS-HCC) 2016  . Microalbuminuria  . Other and unspecified hyperlipidemia 11/04/2013  . Type 2 diabetes mellitus (CMS-HCC) 09/23/2013  . Type II or unspecified type diabetes mellitus without mention of complication,  uncontrolled 11/04/2013   Past Surgical History He has a past surgical history that includes HX OF EYE SURGERY (2008); Cystectomy (1972); Coronary angioplasty; Colonoscopy (04/17/2018); and Cataract extraction.   Medications and Allergies  Current Medications  Current Outpatient Medications  Medication Sig Dispense Refill  . aspirin 81 MG EC tablet Take 81 mg by mouth once daily.  Marland Kitchen atorvastatin (LIPITOR) 20 MG tablet TAKE 1 TABLET BY MOUTH ONCE DAILY 90 tablet 1  . brimonidine (ALPHAGAN) 0.2 % ophthalmic solution Place 1 drop into both eyes 2 (two) times daily.  . cyanocobalamin (VITAMIN B12) 1000 MCG tablet Take 1,000 mcg by mouth once daily.  . dorzolamide (TRUSOPT) 2 % ophthalmic solution  . dorzolamide-timolol (COSOPT) 22.3-6.8 mg/mL ophthalmic solution INT 1 GTT INTO OU BID  . fluorometholone (FML) 0.1 % ophthalmic suspension Place 1 drop into both eyes 2 (two) times daily 1  . glimepiride (AMARYL) 2 MG tablet TAKE 1 TABLET BY MOUTH DAILY WITH BREAKFAST 90 tablet 0  . INVOKANA 300 mg tablet TAKE 1/2 TABLET BY MOUTH ONCE DAILY 45 tablet 1  . latanoprost (XALATAN) 0.005 % ophthalmic solution 1 drop both eyes twice daily 1  . liraglutide (VICTOZA 3-PAK) 0.6 mg/0.1 mL (18 mg/3 mL) pen injector Inject 0.3 mLs (1.8 mg total) subcutaneously once daily 27 mL 3  . metFORMIN (GLUCOPHAGE) 1000 MG tablet TAKE 1 TABLET BY MOUTH TWICE DAILY WITH MEALS 180 tablet 1  . multivitamin with iron-minerals (VITAMINS AND MINERALS) tablet Take 1 tablet by mouth once daily  . pioglitazone (ACTOS) 15 MG tablet TAKE 1 TABLET BY MOUTH ONCE DAILY 90 tablet 1  . testosterone (ANDROGEL) 12.5 mg/1.25 gram (1 %) gel in metered  dose pump Apply 4 Pump (50 mg of testosterone total) topically once daily Apply to upper arms and/or abdomen. for 30 days 75 g 2   No current facility-administered medications for this visit.   Allergies: Trulicity [dulaglutide]  Social and Family History  Social History reports that he has  never smoked. He has never used smokeless tobacco. He reports that he does not drink alcohol or use drugs.  Family History Family History  Problem Relation Age of Onset  . Diabetes Mother  . Breast cancer Mother  . Diabetes type II Mother  . Glaucoma Mother  . Other Father  nerve problem  . Diabetes Paternal Uncle  . Myocardial Infarction (Heart attack) Paternal Uncle  . Colon cancer Neg Hx  . Colon polyps Neg Hx  . Rectal cancer Neg Hx  . Liver disease Neg Hx   Review of Systems   Review of Systems: The patient denies chest pain, shortness of breath, orthopnea, paroxysmal nocturnal dyspnea, pedal edema, palpitations, heart racing, fatigue, dizziness, lightheadedness, presyncope, syncope, leg pain, leg cramping. Review of 12 Systems is negative except as described in HPI.   Physical Examination   Vitals:BP 120/74  Pulse 97  Resp 16  Ht 177.8 cm (5\' 10" )  Wt 97.1 kg (214 lb)  BMI 30.71 kg/m  Ht:177.8 cm (5\' 10" ) Wt:97.1 kg (214 lb) AVW:UJWJ surface area is 2.19 meters squared. Body mass index is 30.71 kg/m.  General: Well developed, well nourished. In no acute distress HEENT: Pupils equally reactive to light and accomodation  Neck: Supple without thyromegaly, or goiter. Carotid pulses 2+. No carotid bruits present.  Pulmonary: Clear to auscultation bilaterally; no wheezes, rales, rhonchi Cardiovascular: Regular rate and rhythm. No gallops, murmurs or rubs Gastrointestinal: Soft nontender, nondistended, with normal bowel sounds Extremities: No cyanosis, clubbing, or edema Peripheral Pulses: 2+ in upper extremities, 2+ in lower extremities  Neurology: Alert and oriented X3 Pysch: Good affect. Responds appropriately  Assessment and Plan   69 y.o. male with  1. Coronary artery disease of native artery of native heart with stable angina pectoris (CMS-HCC)  -Continue aspirin 81mg  once daily and atorvastatin 20mg  once daily  2. Essential hypertension, benign  -Well  controlled off of antihypertensive medications -Continue to manage with diet and exercise  3. Type 2 diabetes mellitus with other circulatory complication, without long-term current use of insulin (CMS-HCC)  -Continue current medication regimen with routine follow up with Dr. Ginette Pitman  4. Hyperlipidemia associated with type 2 diabetes mellitus (CMS-HCC)  -Continue atorvastatin 20mg  once daily with LDL goal <70; most recent calculated at 54     No orders of the defined types were placed in this encounter.  Return in about 6 months (around 07/31/2019).  I personally performed the service, non-incident to. ([place])   NICOLE Julian Hy, PA    Electronically signed by Zeb Comfort, PA at 01/28/2019 9:53 AM EDT

## 2019-07-18 ENCOUNTER — Other Ambulatory Visit
Admission: RE | Admit: 2019-07-18 | Discharge: 2019-07-18 | Disposition: A | Discharge status: Home / Self Care | Payer: Medicare Other | Source: Ambulatory Visit | Attending: Urology | Admitting: Urology

## 2019-07-18 DIAGNOSIS — Z20822 Contact with and (suspected) exposure to covid-19: Secondary | ICD-10-CM | POA: Insufficient documentation

## 2019-07-18 DIAGNOSIS — Z01812 Encounter for preprocedural laboratory examination: Secondary | ICD-10-CM | POA: Diagnosis present

## 2019-07-18 LAB — SARS CORONAVIRUS 2 (TAT 6-24 HRS): SARS Coronavirus 2: NEGATIVE

## 2019-07-22 ENCOUNTER — Ambulatory Visit: Payer: Medicare Other | Admitting: Anesthesiology

## 2019-07-22 ENCOUNTER — Ambulatory Visit: Payer: Medicare Other

## 2019-07-22 ENCOUNTER — Encounter: Payer: Self-pay | Admitting: Urology

## 2019-07-22 ENCOUNTER — Ambulatory Visit
Admission: RE | Admit: 2019-07-22 | Discharge: 2019-07-22 | Disposition: A | Discharge status: Home / Self Care | Payer: Medicare Other | Attending: Urology | Admitting: Urology

## 2019-07-22 ENCOUNTER — Encounter
Admission: RE | Disposition: A | Discharge status: Home / Self Care | Payer: Self-pay | Source: Home / Self Care | Attending: Urology

## 2019-07-22 ENCOUNTER — Other Ambulatory Visit: Payer: Self-pay

## 2019-07-22 DIAGNOSIS — K5903 Drug induced constipation: Secondary | ICD-10-CM | POA: Insufficient documentation

## 2019-07-22 DIAGNOSIS — Z7982 Long term (current) use of aspirin: Secondary | ICD-10-CM | POA: Insufficient documentation

## 2019-07-22 DIAGNOSIS — Z955 Presence of coronary angioplasty implant and graft: Secondary | ICD-10-CM | POA: Insufficient documentation

## 2019-07-22 DIAGNOSIS — Z888 Allergy status to other drugs, medicaments and biological substances status: Secondary | ICD-10-CM | POA: Insufficient documentation

## 2019-07-22 DIAGNOSIS — I252 Old myocardial infarction: Secondary | ICD-10-CM | POA: Insufficient documentation

## 2019-07-22 DIAGNOSIS — K219 Gastro-esophageal reflux disease without esophagitis: Secondary | ICD-10-CM | POA: Insufficient documentation

## 2019-07-22 DIAGNOSIS — E1121 Type 2 diabetes mellitus with diabetic nephropathy: Secondary | ICD-10-CM | POA: Diagnosis not present

## 2019-07-22 DIAGNOSIS — E11319 Type 2 diabetes mellitus with unspecified diabetic retinopathy without macular edema: Secondary | ICD-10-CM | POA: Diagnosis not present

## 2019-07-22 DIAGNOSIS — N201 Calculus of ureter: Secondary | ICD-10-CM

## 2019-07-22 DIAGNOSIS — Z7984 Long term (current) use of oral hypoglycemic drugs: Secondary | ICD-10-CM | POA: Diagnosis not present

## 2019-07-22 DIAGNOSIS — N132 Hydronephrosis with renal and ureteral calculous obstruction: Secondary | ICD-10-CM | POA: Diagnosis present

## 2019-07-22 DIAGNOSIS — N200 Calculus of kidney: Secondary | ICD-10-CM

## 2019-07-22 DIAGNOSIS — Z79899 Other long term (current) drug therapy: Secondary | ICD-10-CM | POA: Insufficient documentation

## 2019-07-22 HISTORY — PX: CYSTOSCOPY/URETEROSCOPY/HOLMIUM LASER/STENT PLACEMENT: SHX6546

## 2019-07-22 LAB — GLUCOSE, CAPILLARY
Glucose-Capillary: 198 mg/dL — ABNORMAL HIGH (ref 70–99)
Glucose-Capillary: 179 mg/dL — ABNORMAL HIGH (ref 70–99)

## 2019-07-22 SURGERY — CYSTOSCOPY/URETEROSCOPY/HOLMIUM LASER/STENT PLACEMENT
Anesthesia: Choice | Laterality: Left

## 2019-07-22 MED ORDER — PROPOFOL 10 MG/ML IV BOLUS
INTRAVENOUS | Status: DC | PRN
  Administered 2019-07-22: 200 mg via INTRAVENOUS

## 2019-07-22 MED ORDER — IOPAMIDOL (ISOVUE-200) INJECTION 41%
INTRAVENOUS | Status: DC | PRN
  Administered 2019-07-22: 20 mL

## 2019-07-22 MED ORDER — FENTANYL CITRATE (PF) 100 MCG/2ML IJ SOLN
INTRAMUSCULAR | Status: DC | PRN
  Administered 2019-07-22 (×2): 50 ug via INTRAVENOUS

## 2019-07-22 MED ORDER — FAMOTIDINE 20 MG PO TABS
ORAL_TABLET | ORAL | Status: AC
  Administered 2019-07-22: 20 mg via ORAL
  Filled 2019-07-22: qty 1

## 2019-07-22 MED ORDER — FENTANYL CITRATE (PF) 100 MCG/2ML IJ SOLN
INTRAMUSCULAR | Status: AC
  Filled 2019-07-22: qty 2

## 2019-07-22 MED ORDER — ACETAMINOPHEN 10 MG/ML IV SOLN
INTRAVENOUS | Status: AC
  Filled 2019-07-22: qty 100

## 2019-07-22 MED ORDER — PHENYLEPHRINE HCL (PRESSORS) 10 MG/ML IV SOLN
INTRAVENOUS | Status: DC | PRN
  Administered 2019-07-22 (×4): 100 ug via INTRAVENOUS

## 2019-07-22 MED ORDER — ONDANSETRON HCL 4 MG/2ML IJ SOLN
INTRAMUSCULAR | Status: DC | PRN
  Administered 2019-07-22: 4 mg via INTRAVENOUS

## 2019-07-22 MED ORDER — KETOROLAC TROMETHAMINE 30 MG/ML IJ SOLN
30.0000 mg | Freq: Once | INTRAMUSCULAR | Status: AC
  Administered 2019-07-22: 30 mg via INTRAVENOUS

## 2019-07-22 MED ORDER — KETOROLAC TROMETHAMINE 30 MG/ML IJ SOLN
INTRAMUSCULAR | Status: AC
  Filled 2019-07-22: qty 1

## 2019-07-22 MED ORDER — MIDAZOLAM HCL 2 MG/2ML IJ SOLN
INTRAMUSCULAR | Status: AC
  Filled 2019-07-22: qty 2

## 2019-07-22 MED ORDER — ONDANSETRON HCL 4 MG/2ML IJ SOLN
INTRAMUSCULAR | Status: AC
  Filled 2019-07-22: qty 2

## 2019-07-22 MED ORDER — CEFAZOLIN SODIUM-DEXTROSE 2-4 GM/100ML-% IV SOLN
INTRAVENOUS | Status: AC
  Filled 2019-07-22: qty 100

## 2019-07-22 MED ORDER — FAMOTIDINE 20 MG PO TABS: 20.0000 mg | ORAL_TABLET | Freq: Once | ORAL | Status: AC

## 2019-07-22 MED ORDER — PROPOFOL 10 MG/ML IV BOLUS
INTRAVENOUS | Status: AC
  Filled 2019-07-22: qty 20

## 2019-07-22 MED ORDER — ACETAMINOPHEN 10 MG/ML IV SOLN
INTRAVENOUS | Status: DC | PRN
  Administered 2019-07-22: 1000 mg via INTRAVENOUS

## 2019-07-22 MED ORDER — SODIUM CHLORIDE 0.9 % IV SOLN: INTRAVENOUS | Status: DC

## 2019-07-22 MED ORDER — SUGAMMADEX SODIUM 200 MG/2ML IV SOLN
INTRAVENOUS | Status: DC | PRN
  Administered 2019-07-22: 200 mg via INTRAVENOUS

## 2019-07-22 MED ORDER — CEFAZOLIN SODIUM-DEXTROSE 2-4 GM/100ML-% IV SOLN
2.0000 g | INTRAVENOUS | Status: AC
  Administered 2019-07-22: 11:00:00 2 g via INTRAVENOUS

## 2019-07-22 MED ORDER — MIDAZOLAM HCL 2 MG/2ML IJ SOLN
INTRAMUSCULAR | Status: DC | PRN
  Administered 2019-07-22: 2 mg via INTRAVENOUS

## 2019-07-22 MED ORDER — HYDROCODONE-ACETAMINOPHEN 5-325 MG PO TABS
1.0000 | ORAL_TABLET | Freq: Once | ORAL | Status: AC
  Administered 2019-07-22: 1 via ORAL

## 2019-07-22 MED ORDER — LIDOCAINE HCL (CARDIAC) PF 100 MG/5ML IV SOSY
PREFILLED_SYRINGE | INTRAVENOUS | Status: DC | PRN
  Administered 2019-07-22: 100 mg via INTRAVENOUS

## 2019-07-22 MED ORDER — HYDROCODONE-ACETAMINOPHEN 5-325 MG PO TABS
ORAL_TABLET | ORAL | Status: AC
  Filled 2019-07-22: qty 1

## 2019-07-22 MED ORDER — ROCURONIUM BROMIDE 100 MG/10ML IV SOLN
INTRAVENOUS | Status: DC | PRN
  Administered 2019-07-22 (×2): 10 mg via INTRAVENOUS
  Administered 2019-07-22: 40 mg via INTRAVENOUS
  Administered 2019-07-22: 20 mg via INTRAVENOUS

## 2019-07-22 MED ORDER — SUGAMMADEX SODIUM 200 MG/2ML IV SOLN
INTRAVENOUS | Status: AC
  Filled 2019-07-22: qty 2

## 2019-07-22 SURGICAL SUPPLY — 30 items
ADHESIVE MASTISOL STRL (MISCELLANEOUS) ×3 IMPLANT
BAG DRAIN CYSTO-URO LG1000N (MISCELLANEOUS) ×3 IMPLANT
BASKET ZERO TIP 1.9FR (BASKET) ×3 IMPLANT
BRUSH SCRUB EZ 1% IODOPHOR (MISCELLANEOUS) ×3 IMPLANT
CATH URETL 5X70 OPEN END (CATHETERS) ×3 IMPLANT
CNTNR SPEC 2.5X3XGRAD LEK (MISCELLANEOUS)
CONT SPEC 4OZ STER OR WHT (MISCELLANEOUS)
CONTAINER SPEC 2.5X3XGRAD LEK (MISCELLANEOUS) IMPLANT
DRAPE UTILITY 15X26 TOWEL STRL (DRAPES) ×3 IMPLANT
DRSG TEGADERM 2-3/8X2-3/4 SM (GAUZE/BANDAGES/DRESSINGS) ×3 IMPLANT
FIBER LASER TRAC TIP (UROLOGICAL SUPPLIES) IMPLANT
GLOVE BIO SURGEON STRL SZ 6.5 (GLOVE) ×2 IMPLANT
GLOVE BIO SURGEONS STRL SZ 6.5 (GLOVE) ×1
GOWN STRL REUS W/ TWL LRG LVL3 (GOWN DISPOSABLE) ×2 IMPLANT
GOWN STRL REUS W/TWL LRG LVL3 (GOWN DISPOSABLE) ×4
GUIDEWIRE GREEN .038 145CM (MISCELLANEOUS) ×3 IMPLANT
GUIDEWIRE STR DUAL SENSOR (WIRE) ×3 IMPLANT
INFUSOR MANOMETER BAG 3000ML (MISCELLANEOUS) ×3 IMPLANT
INTRODUCER DILATOR DOUBLE (INTRODUCER) ×3 IMPLANT
KIT TURNOVER CYSTO (KITS) ×3 IMPLANT
PACK CYSTO AR (MISCELLANEOUS) ×3 IMPLANT
SET CYSTO W/LG BORE CLAMP LF (SET/KITS/TRAYS/PACK) ×3 IMPLANT
SHEATH URETERAL 12FR 45CM (SHEATH) ×3 IMPLANT
SHEATH URETERAL 12FRX35CM (MISCELLANEOUS) IMPLANT
SOL .9 NS 3000ML IRR  AL (IV SOLUTION) ×2
SOL .9 NS 3000ML IRR UROMATIC (IV SOLUTION) ×1 IMPLANT
STENT URET 6FRX24 CONTOUR (STENTS) IMPLANT
STENT URET 6FRX26 CONTOUR (STENTS) ×3 IMPLANT
SURGILUBE 2OZ TUBE FLIPTOP (MISCELLANEOUS) ×3 IMPLANT
WATER STERILE IRR 1000ML POUR (IV SOLUTION) ×3 IMPLANT

## 2019-07-22 NOTE — Op Note (Signed)
Date of procedure: 07/22/19  Preoperative diagnosis:  1. Left UPJ stone 2. Left lower pole stone    Postoperative diagnosis:  1. Same as above   Procedure: 1. Left ureteroscopy with laser lithotripsy 2. Left retrograde pyelogram 3. Left ureteral stent change 4. Basket extraction of stone fragment  Surgeon: Vanna Scotland, MD  Anesthesia: General  Complications: None  Intraoperative findings: Very hard large left renal pelvic stone as well as lower pole stone.  Significant fragments extracted via basket.  Normal retrograde pyelogram following stone manipulation.  Stent replaced.  EBL: Minimal  Specimens: Stone fragment  Drains: 6 x 26 French double-J ureteral stent on right, tether left in place  Indication: Stillman Buenger is a 69 y.o. patient with left UPJ stone status post ureteral stent placement he returns today for definitive management of his stone.  After reviewing the management options for treatment, he elected to proceed with the above surgical procedure(s). We have discussed the potential benefits and risks of the procedure, side effects of the proposed treatment, the likelihood of the patient achieving the goals of the procedure, and any potential problems that might occur during the procedure or recuperation. Informed consent has been obtained.  Description of procedure:  The patient was taken to the operating room and general anesthesia was induced.  The patient was placed in the dorsal lithotomy position, prepped and draped in the usual sterile fashion, and preoperative antibiotics were administered. A preoperative time-out was performed.   A 68 French scope was advanced per urethra into the bladder.  Attention was turned to the left ureter orifice which a ureteral stent was seen emanating.  The distal coil the stent was grasped using a stent grasper brought to level urethral meatus.  Unfortunately, I ended inadvertently completely dislodging the stent thus the  cystoscope was replaced and a 5 [place] open-ended ureteral catheter was pointed towards the UO.  I was able to advance sensor wire up to level the kidney without difficulty which coiled in the renal pelvis.  Next, a dual-lumen catheter was used to introduce a second Super Stiff wire.  The sensor wire was snapped in place.  Under fluoroscopic guidance, a Adriana Simas 12/14 French ureteral access sheath was advanced to level the proximal ureter.  The inner lumen and wire were removed.  A flexible digital dual-lumen ureteroscope was then brought in using a laser, 200 m, the stone within the renal pelvis was encountered and fragmented.  Initially, dusting settings of 0.2 J and 40 Hz were used but later 1.2 J and 15 Hz were used.  This created innumerable fragments.  I was able to advance the scope into the lower pole as well or another relatively large stone was identified and fragmented.  This created a large amount of stone debris and fragments.  A 1.9 French tipless nitinol basket was used to clear the collecting system of these fragments.  This was relatively painstaking as there was innumerable pieces.  Ultimately, I was able to adequately clear the entire renal pelvis and calyces of significant stone debris.  The laser was brought back in 1 additional time and any residual very small fragments were further dusted until this seemed satisfactory.  Finally, retrograde pyelogram was created by injecting contrast through the scope.  This created a roadmap of the kidney.  No extravasation was appreciated.  Each and every calyx was directly visualized with no significant residual stone burden.  The scope was then backed down the length the ureter removing the access sheath  along the way.  The safety wire was backloaded over rigid cystoscope.  A 6 x 26 French double-J ureteral stent was advanced over the wire up to level the renal pelvis.  The wires partially drawn till full coils noted within the renal pelvis with.  The wire  was then fully withdrawn and full coils noted within the bladder.  The stent string was left affixed to the distal coil of the bladder.  The bladder was drained.  The string was attached to the patient's glans using muscle and Tegaderm.  He was then cleaned and dried, repositioned in supine position after being reversed from from anesthesia and taken to the PACU in stable condition.  Plan: Patient remove his own stent on Thursday.  Follow-up in 4 weeks with renal ultrasound prior.  He requires no additional prescriptions as he already has narcotics at home.  All questions answered.  Hollice Espy, M.D.

## 2019-07-22 NOTE — Interval H&P Note (Signed)
H&P updated.  Returns today for staged procedure.  Status post left stent.  Definitive management of stone today.  Regular rate and rhythm Clear to auscultation bilaterally  Preop urine culture negative.

## 2019-07-22 NOTE — Transfer of Care (Signed)
Immediate Anesthesia Transfer of Care Note  Patient: Jason Harrison  Procedure(s) Performed: CYSTOSCOPY/URETEROSCOPY/HOLMIUM LASER/STENT Exchange (Left )  Patient Location: PACU  Anesthesia Type:General  Level of Consciousness: awake, alert  and oriented  Airway & Oxygen Therapy: Patient connected to face mask oxygen  Post-op Assessment: Post -op Vital signs reviewed and stable  Post vital signs: stable  Last Vitals:  Vitals Value Taken Time  BP 138/79 07/22/19 1333  Temp 97.7   Pulse 75 07/22/19 1338  Resp 0 07/22/19 1338  SpO2 100 % 07/22/19 1338  Vitals shown include unvalidated device data.  Last Pain:  Vitals:   07/22/19 1333  TempSrc:   PainSc: (P) 0-No pain         Complications: No apparent anesthesia complications

## 2019-07-22 NOTE — Anesthesia Preprocedure Evaluation (Addendum)
Anesthesia Evaluation  Patient identified by MRN, date of birth, ID band Patient awake    Reviewed: Allergy & Precautions, NPO status , Patient's Chart, lab work & pertinent test results  History of Anesthesia Complications Negative for: history of anesthetic complications  Airway Mallampati: III       Dental   Pulmonary neg sleep apnea, neg COPD, Not current smoker,           Cardiovascular (-) hypertension+ Past MI and + Cardiac Stents  (-) CHF (-) dysrhythmias (-) Valvular Problems/Murmurs     Neuro/Psych neg Seizures    GI/Hepatic Neg liver ROS, GERD  Medicated and Controlled,  Endo/Other  diabetes, Type 2, Oral Hypoglycemic Agents  Renal/GU Renal InsufficiencyRenal disease     Musculoskeletal   Abdominal   Peds  Hematology   Anesthesia Other Findings   Reproductive/Obstetrics                            Anesthesia Physical Anesthesia Plan  ASA: III  Anesthesia Plan:    Post-op Pain Management:    Induction: Intravenous  PONV Risk Score and Plan: 1 and Ondansetron  Airway Management Planned: Oral ETT  Additional Equipment:   Intra-op Plan:   Post-operative Plan:   Informed Consent: I have reviewed the patients History and Physical, chart, labs and discussed the procedure including the risks, benefits and alternatives for the proposed anesthesia with the patient or authorized representative who has indicated his/her understanding and acceptance.       Plan Discussed with:   Anesthesia Plan Comments:         Anesthesia Quick Evaluation

## 2019-07-22 NOTE — Anesthesia Procedure Notes (Signed)
Procedure Name: Intubation Date/Time: 07/22/2019 11:20 AM Performed by: Irving Burton, CRNA Pre-anesthesia Checklist: Patient identified, Emergency Drugs available, Suction available and Patient being monitored Patient Re-evaluated:Patient Re-evaluated prior to induction Oxygen Delivery Method: Circle system utilized Preoxygenation: Pre-oxygenation with 100% oxygen Induction Type: IV induction Ventilation: Oral airway inserted - appropriate to patient size and Mask ventilation without difficulty Laryngoscope Size: McGraph and 4 Grade View: Grade I Tube type: Oral Tube size: 7.5 mm Number of attempts: 1 Airway Equipment and Method: Stylet and Video-laryngoscopy Placement Confirmation: ETT inserted through vocal cords under direct vision,  positive ETCO2 and breath sounds checked- equal and bilateral Secured at: 22 cm Tube secured with: Tape Dental Injury: Teeth and Oropharynx as per pre-operative assessment

## 2019-07-22 NOTE — Discharge Instructions (Addendum)
You have a ureteral stent in place.  This is a tube that extends from your kidney to your bladder.  This may cause urinary bleeding, burning with urination, and urinary frequency.  Please call our office or present to the ED if you develop fevers >101 or pain which is not able to be controlled with oral pain medications.  You may be given either Flomax and/ or ditropan to help with bladder spasms and stent pain in addition to pain medications.    Your stent is on a string.  It is taped to the head of your penis.  On Thursday morning, you may untaped this stent string and pulled gently until the entire stent was removed.  If you have any issues concerns or questions, please call our office and we are happy to assist.  [facility] [address], [address] [place], [place] [address] [phone]    AMBULATORY SURGERY  DISCHARGE INSTRUCTIONS   1) The drugs that you were given will stay in your system until tomorrow so for the next 24 hours you should not:  A) Drive an automobile B) Make any legal decisions C) Drink any alcoholic beverage   2) You may resume regular meals tomorrow.  Today it is better to start with liquids and gradually work up to solid foods.  You may eat anything you prefer, but it is better to start with liquids, then soup and crackers, and gradually work up to solid foods.   3) Please notify your doctor immediately if you have any unusual bleeding, trouble breathing, redness and pain at the surgery site, drainage, fever, or pain not relieved by medication.    4) Additional Instructions:        Please contact your physician with any problems or Same Day Surgery at [phone], Monday through Friday 6 am to 4 pm, or [place] at [facility] number at [phone].

## 2019-07-22 NOTE — Anesthesia Postprocedure Evaluation (Signed)
Anesthesia Post Note  Patient: Jason Harrison  Procedure(s) Performed: CYSTOSCOPY/URETEROSCOPY/HOLMIUM LASER/STENT Exchange (Left )  Patient location during evaluation: PACU Anesthesia Type: General Level of consciousness: awake and alert Pain management: pain level controlled Vital Signs Assessment: post-procedure vital signs reviewed and stable Respiratory status: spontaneous breathing and respiratory function stable Cardiovascular status: stable Anesthetic complications: no     Last Vitals:  Vitals:   07/22/19 1348 07/22/19 1403  BP: 136/75 140/75  Pulse: 82 79  Resp: 14 16  Temp:  36.5 C  SpO2: 94% 96%    Last Pain:  Vitals:   07/22/19 1403  TempSrc:   PainSc: 0-No pain                 Kenora Spayd K

## 2019-07-26 LAB — CALCULI, WITH PHOTOGRAPH (CLINICAL LAB)
Calcium Oxalate Monohydrate: 100 %
Weight Calculi: 141 mg

## 2019-08-15 ENCOUNTER — Other Ambulatory Visit: Payer: Self-pay

## 2019-08-15 ENCOUNTER — Ambulatory Visit
Admission: RE | Admit: 2019-08-15 | Discharge: 2019-08-15 | Disposition: A | Discharge status: Home / Self Care | Payer: Medicare Other | Source: Ambulatory Visit | Attending: Urology | Admitting: Urology

## 2019-08-15 DIAGNOSIS — N201 Calculus of ureter: Secondary | ICD-10-CM

## 2019-08-15 IMAGING — US US RENAL
1 series · 14 of 25 positions shown · non-contrast
Comparison: 07/05/2019

CLINICAL DATA: Left UPJ obstruction due to calculus status post
left ureteroscopy

EXAM:
RENAL / URINARY TRACT ULTRASOUND COMPLETE

[Series 1: us renal · 0.24mm/px · 14 of 39 slices shown]
[im 1/39]
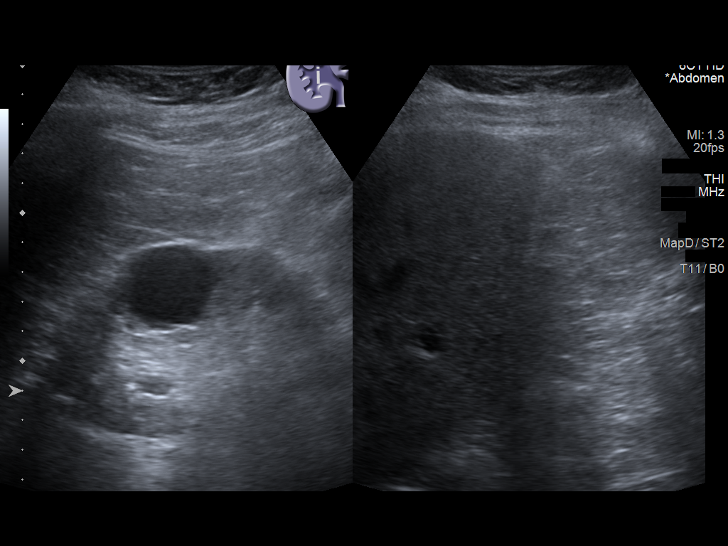
[im 4/39]
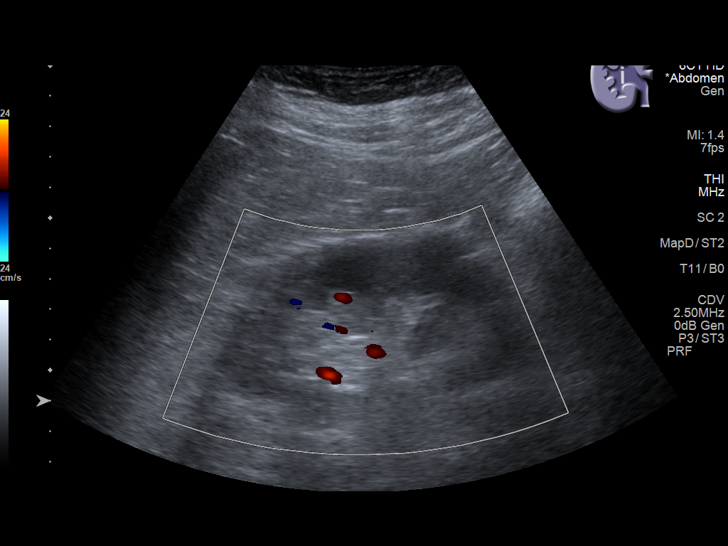
[im 7/39]
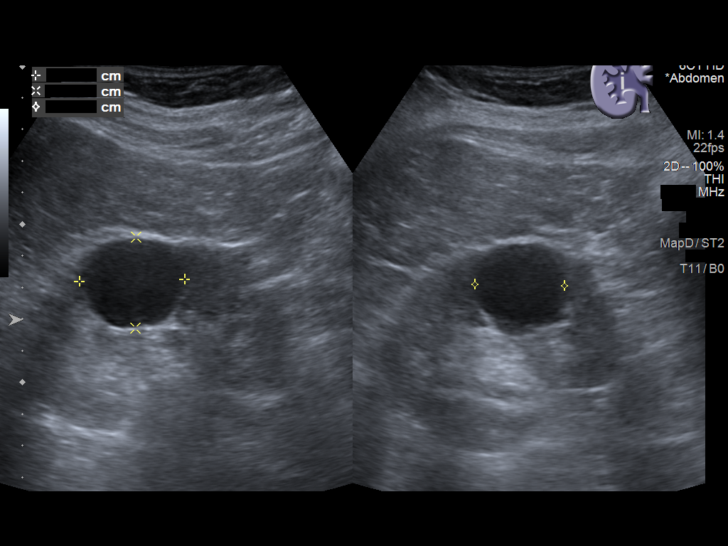
[im 10/39]
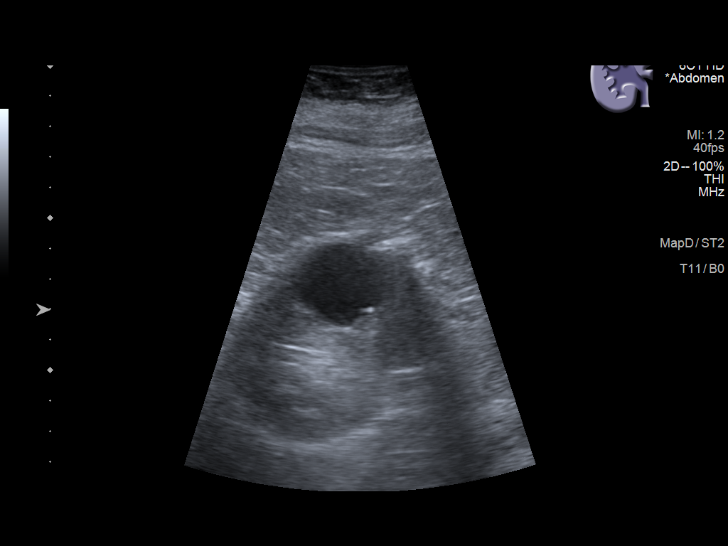
[im 13/39]
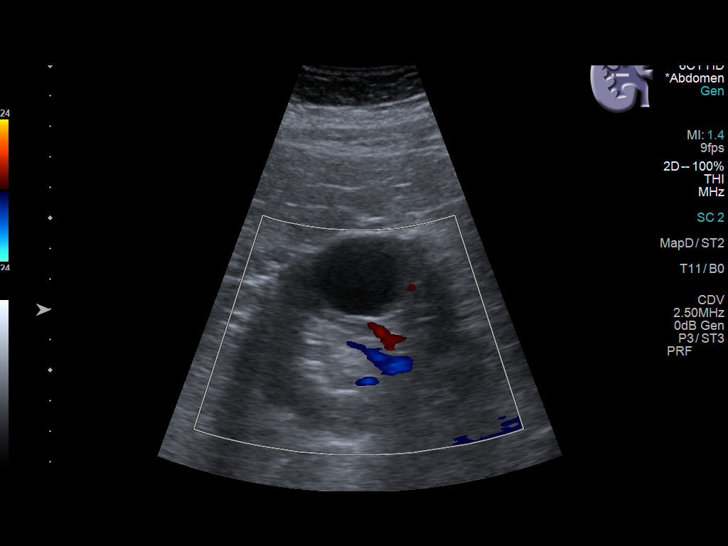
[im 15/39]
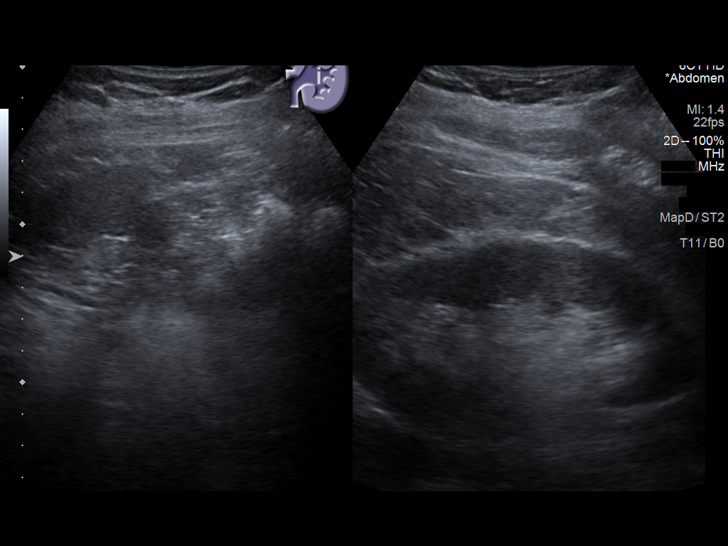
[im 18/39]
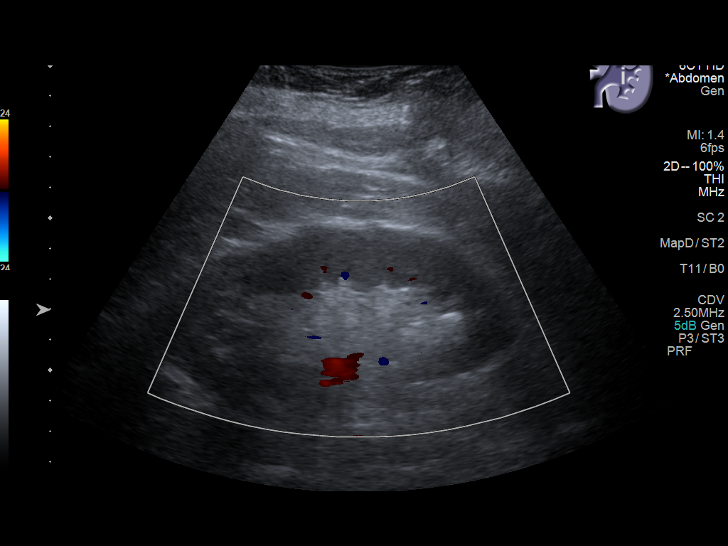
[im 21/39]
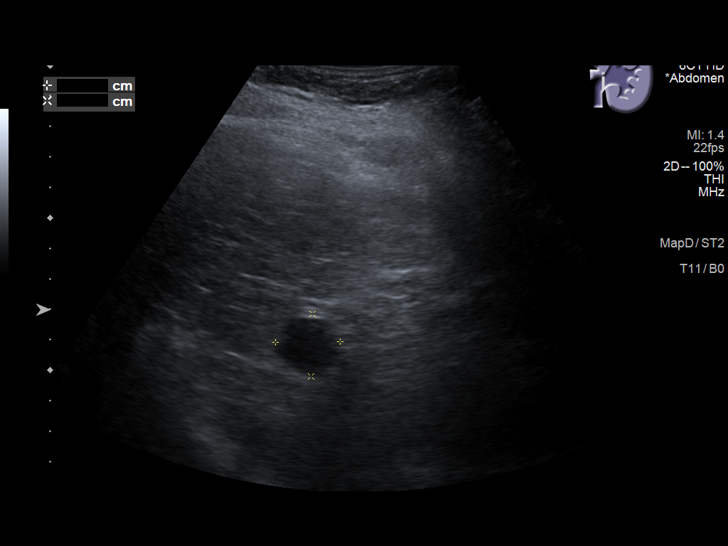
[im 24/39]
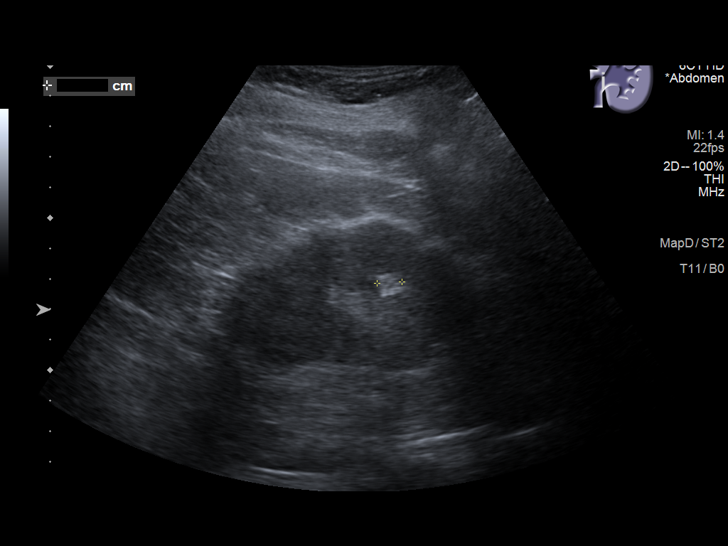
[im 26/39]
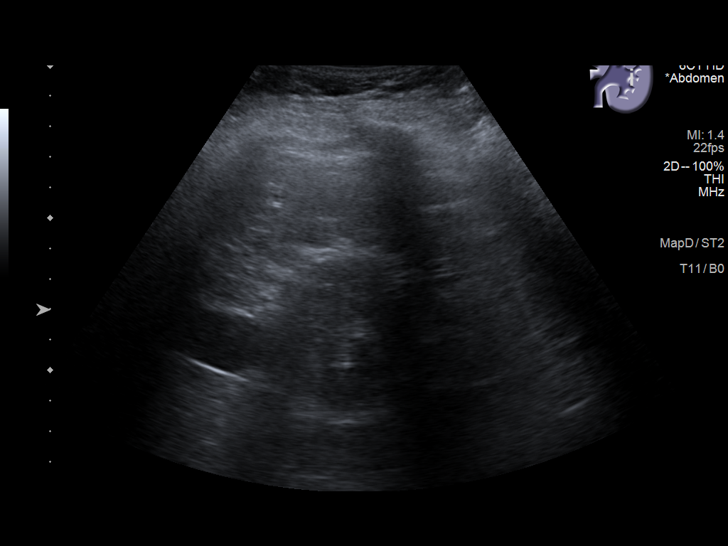
[im 29/39]
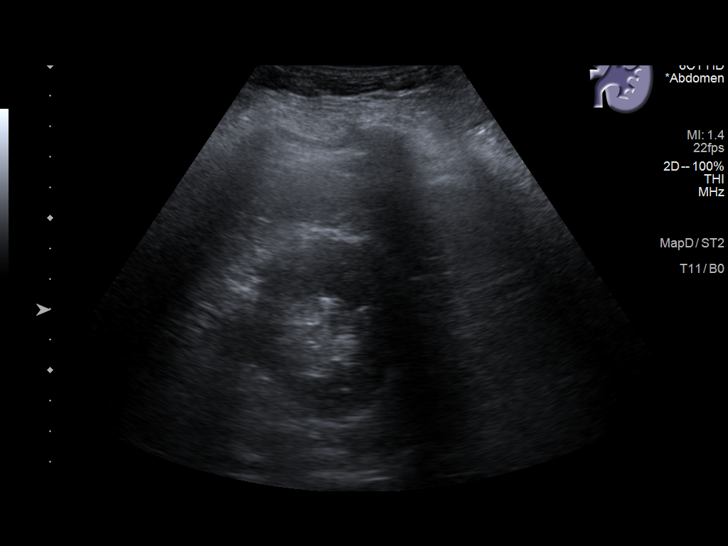
[im 32/39]
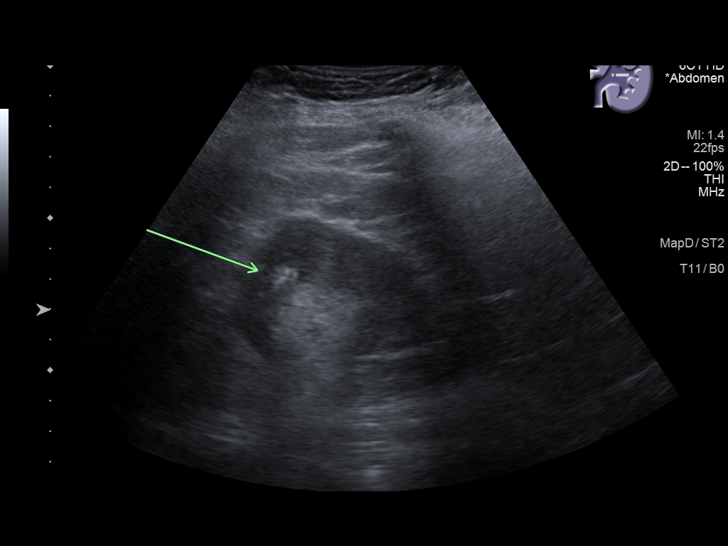
[im 35/39]
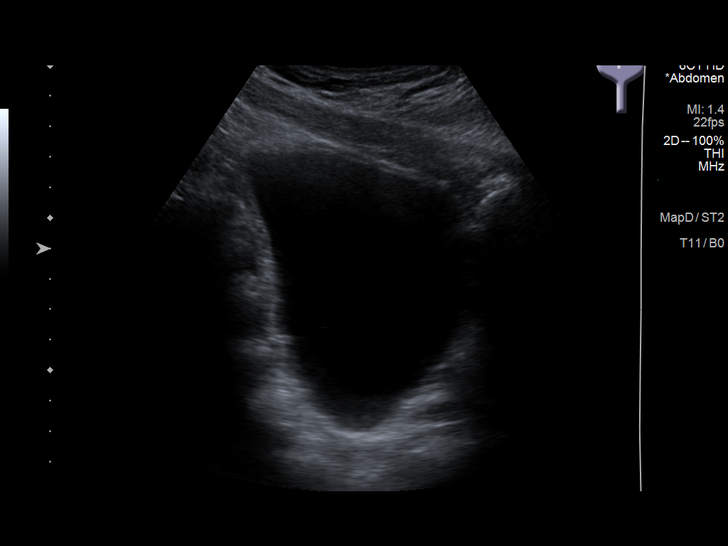
[im 39/39]
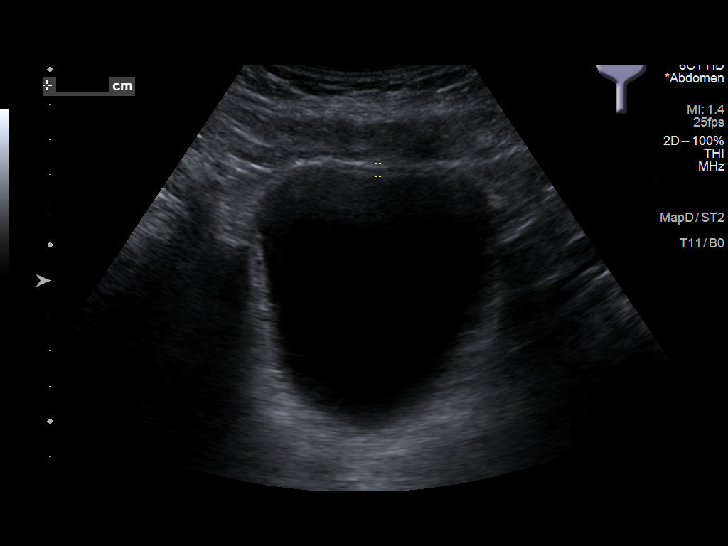

[14 of 25 positions shown; findings below may reference images not displayed]

FINDINGS: Right Kidney:

Renal measurements: 10.4 x 6.1 by 6.0 cm = volume: 201 mL. Right
renal echotexture is normal. There is a simple cyst measuring 3.3 x
2.8 x 2.8 cm.

Left Kidney:

Renal measurements: 11.1 x 6.1 by 4.5 cm. = volume: 154 mL.
Echotexture is normal. There is a simple cyst measuring 2.1 x 2.1 by
2.2 cm. An 8 mm calculus is seen within the lower pole left kidney.
No left-sided obstructive uropathy.

Bladder:

Appears normal for degree of bladder distention.

Other:

None.
IMPRESSION: 1. Nonobstructing left renal calculus.
2. Bilateral renal cysts.

## 2019-08-19 ENCOUNTER — Other Ambulatory Visit: Payer: Self-pay

## 2019-08-19 ENCOUNTER — Ambulatory Visit (INDEPENDENT_AMBULATORY_CARE_PROVIDER_SITE_OTHER): Payer: Medicare Other | Admitting: Physician Assistant

## 2019-08-19 ENCOUNTER — Encounter: Payer: Self-pay | Admitting: Physician Assistant

## 2019-08-19 VITALS — BP 144/86 | HR 89 | Ht 70.0 in | Wt 220.0 lb

## 2019-08-19 DIAGNOSIS — N200 Calculus of kidney: Secondary | ICD-10-CM

## 2019-08-19 DIAGNOSIS — R972 Elevated prostate specific antigen [PSA]: Secondary | ICD-10-CM | POA: Diagnosis not present

## 2019-08-19 NOTE — Progress Notes (Signed)
08/19/2019 9:28 AM   Jason Harrison [birth year] [id number]  CC: Follow-up URS  HPI: Jason Harrison is a 69 y.o. male who presents today for follow-up of left ureteroscopy with laser lithotripsy and stent exchange for management of a 67mm obstructing left UPJ stone and an 76mm nonobstructing left lower pole stone with Dr. Apolinar Junes on 07/22/2019.  Operative note reports complete fragmentation of both left-sided stones with a normal retrograde pyelogram at the completion of the procedure.  Stent removed at home on POD 3 without difficulty.  Patient reports some continued occasional, brief twinges of pain in his left flank that are self resolving and do not require pain medication.  Stone analysis resulted with 100% calcium oxalate monohydrate.  Follow-up renal ultrasound on 08/15/2019 revealed bilateral simple renal cysts and an 8 mm nonobstructing left lower pole calculus with resolved left-sided hydronephrosis.  Additionally, patient reports that his PSA has risen, per his PCP.  PSA on 08/03/2018 was 0.67; PSA on 08/08/2019 was 2.81.  His PCP has recommended repeat PSA in 6 months.  PMH: Past Medical History:  Diagnosis Date  . Diabetes mellitus without complication (HCC)    type 2  . Diabetic nephropathy (HCC)   . Diabetic retinopathy (HCC)   . Myocardial infarction [facility]) 2016   ONE STENT    Surgical History: Past Surgical History:  Procedure Laterality Date  . CARDIAC CATHETERIZATION N/A 01/15/2015   Procedure: Left Heart Cath and Coronary Angiography;  Surgeon: Dalia Heading, MD;  Location: [facility] INVASIVE CV LAB;  Service: Cardiovascular;  Laterality: N/A;  . CARDIAC CATHETERIZATION N/A 01/15/2015   Procedure: Coronary Stent Intervention;  Surgeon: Iran Ouch, MD;  Location: [facility] INVASIVE CV LAB;  Service: Cardiovascular;  Laterality: N/A;  . COLONOSCOPY WITH PROPOFOL N/A 04/17/2018   Procedure: COLONOSCOPY WITH PROPOFOL;  Surgeon: Christena Deem, MD;  Location: [facility]  ENDOSCOPY;  Service: Endoscopy;  Laterality: N/A;  . CORONARY ANGIOPLASTY    . CYSTECTOMY  1972  . CYSTOSCOPY WITH STENT PLACEMENT Left 07/11/2019   Procedure: CYSTOSCOPY WITH STENT PLACEMENT;  Surgeon: Vanna Scotland, MD;  Location: [facility] ORS;  Service: Urology;  Laterality: Left;  . CYSTOSCOPY/URETEROSCOPY/HOLMIUM LASER/STENT PLACEMENT Left 07/22/2019   Procedure: CYSTOSCOPY/URETEROSCOPY/HOLMIUM LASER/STENT Exchange;  Surgeon: Vanna Scotland, MD;  Location: [facility] ORS;  Service: Urology;  Laterality: Left;  . EYE SURGERY      Home Medications:  Allergies as of 08/19/2019      Reactions   Trulicity [dulaglutide] Itching, Swelling      Medication List       Accurate as of August 19, 2019  9:28 AM. If you have any questions, ask your nurse or doctor.        Alpha-Lipoic Acid 600 MG Caps Take 600 mg by mouth daily.   aspirin EC 81 MG tablet Take 81 mg by mouth every evening.   atorvastatin 20 MG tablet Commonly known as: LIPITOR Take 20 mg by mouth every evening.   B-12 5000 MCG Caps Take 5,000 mcg by mouth daily.   Biotin 06301 MCG Tabs Take 10,000 mcg by mouth daily.   brimonidine 0.2 % ophthalmic solution Commonly known as: ALPHAGAN Place 1 drop into the right eye 2 (two) times daily.   cetirizine 10 MG tablet Commonly known as: ZYRTEC Take 10 mg by mouth every morning.   CHROMIUM PICOLATE PO Take 5,000 mcg by mouth daily.   Fish Oil 1000 MG Caps Take 1,000 mg by mouth daily.   glimepiride 4 MG tablet Commonly known  as: AMARYL Take 4 mg by mouth daily.   HYDROcodone-acetaminophen 5-325 MG tablet Commonly known as: NORCO/VICODIN Take 1-2 tablets by mouth every 6 (six) hours as needed for moderate pain or severe pain.   ibuprofen 200 MG tablet Commonly known as: ADVIL Take 200-600 mg by mouth every 6 (six) hours as needed for moderate pain.   Invokana 300 MG Tabs tablet Generic drug: canagliflozin Take 150 mg by mouth daily.   latanoprost 0.005 %  ophthalmic solution Commonly known as: XALATAN Place 1 drop into the right eye at bedtime.   LUBRICATING EYE DROPS OP Place 1 drop into both eyes 2 (two) times daily.   Melatonin 5 MG Tabs Take 2.5 mg by mouth at bedtime as needed (sleep).   metFORMIN 1000 MG tablet Commonly known as: GLUCOPHAGE Take 1,000 mg by mouth 2 (two) times daily.   multivitamin with minerals Tabs tablet Take 1 tablet by mouth daily.   ondansetron 4 MG disintegrating tablet Commonly known as: Zofran ODT Take 1 tablet (4 mg total) by mouth every 6 (six) hours as needed for nausea or vomiting.   OVER THE COUNTER MEDICATION Take 1 capsule by mouth daily. Saffron supplement   oxybutynin 5 MG tablet Commonly known as: DITROPAN Take 1 tablet (5 mg total) by mouth every 8 (eight) hours as needed for bladder spasms.   pioglitazone 15 MG tablet Commonly known as: ACTOS Take 15 mg by mouth daily.   PreserVision AREDS 2 Caps Take 1 capsule by mouth 2 (two) times daily.   Probiotic Caps Take 1 capsule by mouth daily.   tamsulosin 0.4 MG Caps capsule Commonly known as: Flomax Take 1 capsule (0.4 mg total) by mouth daily.   ticagrelor 90 MG Tabs tablet Commonly known as: BRILINTA Take 1 tablet (90 mg total) by mouth 2 (two) times daily.   TURMERIC PO Take 320 mg by mouth daily.   valsartan 160 MG tablet Commonly known as: DIOVAN Take 1 tablet (160 mg total) by mouth daily.   Victoza 18 MG/3ML Sopn Generic drug: liraglutide Inject 1.8 mg into the skin at bedtime.   vitamin C 1000 MG tablet Take 1,000 mg by mouth daily.   Vitamin D 50 MCG (2000 UT) tablet Take 2,000 Units by mouth daily.   VITAMIN K PO Take 1 capsule by mouth daily.       Allergies:  Allergies  Allergen Reactions  . Trulicity [Dulaglutide] Itching and Swelling    Family History: No family history on file.  Social History:   reports that he has never smoked. He has never used smokeless tobacco. He reports that he  does not drink alcohol or use drugs.  Physical Exam: BP (!) 144/86   Pulse 89   Ht 5\' 10"  (1.778 m)   Wt 220 lb (99.8 kg)   BMI 31.57 kg/m   Constitutional:  Alert and oriented, no acute distress, nontoxic appearing HEENT: [place], AT Cardiovascular: No clubbing, cyanosis, or edema Respiratory: Normal respiratory effort, no increased work of breathing Skin: No rashes, bruises or suspicious lesions Neurologic: Grossly intact, no focal deficits, moving all 4 extremities Psychiatric: Normal mood and affect  Laboratory Data: Results for orders placed or performed during the hospital encounter of 07/22/19  Calculi, with Photograph (to Clinical Lab)  Result Value Ref Range   Source Calculi Comment    Color Calculi Brown    Size Calculi 5x3 mm   Weight Calculi 141 mg   Composition Calculi Comment    Calcium Oxalate  Monohydrate 100 %   Photo Calculi Comment    Comment Calculi 3 Comment    Please Note: Comment    DISCLAIMER: Comment    Pertinent Imaging: Results for orders placed during the hospital encounter of 08/15/19  US RENAL   Narrative CLINICAL DATA:  Left UPJ obstruction due to calculus status post left ureteroscopy  EXAM: RENAL / URINARY TRACT ULTRASOUND COMPLETE  COMPARISON:  07/05/2019  FINDINGS: Right Kidney:  Renal measurements: 10.4 x 6.1 by 6.0 cm = volume: 201 mL. Right renal echotexture is normal. There is a simple cyst measuring 3.3 x 2.8 x 2.8 cm.  Left Kidney:  Renal measurements: 11.1 x 6.1 by 4.5 cm. = volume: 154 mL. Echotexture is normal. There is a simple cyst measuring 2.1 x 2.1 by 2.2 cm. An 8 mm calculus is seen within the lower pole left kidney. No left-sided obstructive uropathy.  Bladder:  Appears normal for degree of bladder distention.  Other:  None.  IMPRESSION: 1. Nonobstructing left renal calculus. 2. Bilateral renal cysts.   Electronically Signed   By: Sharlet Salina M.D.   On: 08/15/2019 18:31    I personally reviewed  the images referenced above and note the presence of bilateral simple renal cysts as well as a nonobstructing 8 mm left renal calculus.  Assessment & Plan:   1. Nephrolithiasis 69 year old male s/p left ureteroscopy and laser lithotripsy with stent exchange for management of a 14 mm left UPJ stone as well as a nonobstructing 8 mm left lower pole stone.  Follow-up renal ultrasound with resolution of left hydronephrosis, however with residual 8 mm left lower pole stone.  Recommend KUB in 6 months to reassess.  Based on his stone analysis results, I counseled the patient on stone prevention techniques including increasing water intake with a goal of producing 2.5L of urine daily, increased citric acid intake, avoidance of high oxalate-containing foods, and decreasing salt intake.  ABCs of Kidney Stones booklet provided today.   I counseled the patient to contact our office or proceed to the emergency room if he develops new, acute left flank pain associated with nausea, vomiting, fever, or chills.  He expressed understanding.  - Abdomen 1 view (KUB); Future  2. Rising PSA level PSA still WNL, however he has had significant increase in the past year.  I suspect this elevation may be due to recent urologic surgeries.  I agree with PCP to repeat PSA in 6 months.  Can discuss results at scheduled follow-up as above.  Return in about 6 months (around 02/16/2020) for Stone f/u with KUB prior.  Carman Ching, PA-C  [facility] [address], [address] [place], [place] [address] [phone]

## 2019-08-19 NOTE — Patient Instructions (Signed)
Dietary Guidelines to Help Prevent Kidney Stones Kidney stones are deposits of minerals and salts that form inside your kidneys. Your risk of developing kidney stones may be greater depending on your diet, your lifestyle, the medicines you take, and whether you have certain medical conditions. Most people can reduce their chances of developing kidney stones by following the instructions below. Depending on your overall health and the type of kidney stones you tend to develop, your dietitian may give you more specific instructions. What are tips for following this plan? Reading food labels  Choose foods with "no salt added" or "low-salt" labels. Limit your sodium intake to less than 1500 mg per day.  Choose foods with calcium for each meal and snack. Try to eat about 300 mg of calcium at each meal. Foods that contain 200-500 mg of calcium per serving include: ? 8 oz (237 ml) of milk, fortified nondairy milk, and fortified fruit juice. ? 8 oz (237 ml) of kefir, yogurt, and soy yogurt. ? 4 oz (118 ml) of tofu. ? 1 oz of cheese. ? 1 cup (300 g) of dried figs. ? 1 cup (91 g) of cooked broccoli. ? 1-3 oz can of sardines or mackerel.  Most people need 1000 to 1500 mg of calcium each day. Talk to your dietitian about how much calcium is recommended for you. Shopping  Buy plenty of fresh fruits and vegetables. Most people do not need to avoid fruits and vegetables, even if they contain nutrients that may contribute to kidney stones.  When shopping for convenience foods, choose: ? Whole pieces of fruit. ? Premade salads with dressing on the side. ? Low-fat fruit and yogurt smoothies.  Avoid buying frozen meals or prepared deli foods.  Look for foods with live cultures, such as yogurt and kefir. Cooking  Do not add salt to food when cooking. Place a salt shaker on the table and allow each person to add his or her own salt to taste.  Use vegetable protein, such as beans, textured vegetable  protein (TVP), or tofu instead of meat in pasta, casseroles, and soups. Meal planning   Eat less salt, if told by your dietitian. To do this: ? Avoid eating processed or premade food. ? Avoid eating fast food.  Eat less animal protein, including cheese, meat, poultry, or fish, if told by your dietitian. To do this: ? Limit the number of times you have meat, poultry, fish, or cheese each week. Eat a diet free of meat at least 2 days a week. ? Eat only one serving each day of meat, poultry, fish, or seafood. ? When you prepare animal protein, cut pieces into small portion sizes. For most meat and fish, one serving is about the size of one deck of cards.  Eat at least 5 servings of fresh fruits and vegetables each day. To do this: ? Keep fruits and vegetables on hand for snacks. ? Eat 1 piece of fruit or a handful of berries with breakfast. ? Have a salad and fruit at lunch. ? Have two kinds of vegetables at dinner.  Limit foods that are high in a substance called oxalate. These include: ? Spinach. ? Rhubarb. ? Beets. ? Potato chips and french fries. ? Nuts.  If you regularly take a diuretic medicine, make sure to eat at least 1-2 fruits or vegetables high in potassium each day. These include: ? Avocado. ? Banana. ? Orange, prune, carrot, or tomato juice. ? Baked potato. ? Cabbage. ? Beans and split   peas. General instructions   Drink enough fluid to keep your urine clear or pale yellow. This is the most important thing you can do.  Talk to your health care provider and dietitian about taking daily supplements. Depending on your health and the cause of your kidney stones, you may be advised: ? Not to take supplements with vitamin C. ? To take a calcium supplement. ? To take a daily probiotic supplement. ? To take other supplements such as magnesium, fish oil, or vitamin B6.  Take all medicines and supplements as told by your health care provider.  Limit alcohol intake to no  more than 1 drink a day for nonpregnant women and 2 drinks a day for men. One drink equals 12 oz of beer, 5 oz of wine, or 1 oz of hard liquor.  Lose weight if told by your health care provider. Work with your dietitian to find strategies and an eating plan that works best for you. What foods are not recommended? Limit your intake of the following foods, or as told by your dietitian. Talk to your dietitian about specific foods you should avoid based on the type of kidney stones and your overall health. Grains Breads. Bagels. Rolls. Baked goods. Salted crackers. Cereal. Pasta. Vegetables Spinach. Rhubarb. Beets. Canned vegetables. Pickles. Olives. Meats and other protein foods Nuts. Nut butters. Large portions of meat, poultry, or fish. Salted or cured meats. Deli meats. Hot dogs. Sausages. Dairy Cheese. Beverages Regular soft drinks. Regular vegetable juice. Seasonings and other foods Seasoning blends with salt. Salad dressings. Canned soups. Soy sauce. Ketchup. Barbecue sauce. Canned pasta sauce. Casseroles. Pizza. Lasagna. Frozen meals. Potato chips. French fries. Summary  You can reduce your risk of kidney stones by making changes to your diet.  The most important thing you can do is drink enough fluid. You should drink enough fluid to keep your urine clear or pale yellow.  Ask your health care provider or dietitian how much protein from animal sources you should eat each day, and also how much salt and calcium you should have each day. This information is not intended to replace advice given to you by your health care provider. Make sure you discuss any questions you have with your health care provider. Document Revised: 10/10/2018 Document Reviewed: 05/31/2016 Elsevier Patient Education  2020 [employer]  

## 2020-02-17 ENCOUNTER — Ambulatory Visit: Payer: Medicare Other | Admitting: Physician Assistant

## 2020-02-20 ENCOUNTER — Ambulatory Visit (INDEPENDENT_AMBULATORY_CARE_PROVIDER_SITE_OTHER): Payer: Medicare Other | Admitting: Physician Assistant

## 2020-02-20 ENCOUNTER — Ambulatory Visit
Admission: RE | Admit: 2020-02-20 | Discharge: 2020-02-20 | Disposition: A | Discharge status: Home / Self Care | Payer: Medicare Other | Source: Ambulatory Visit | Attending: Physician Assistant | Admitting: Physician Assistant

## 2020-02-20 ENCOUNTER — Ambulatory Visit
Admission: RE | Admit: 2020-02-20 | Discharge: 2020-02-20 | Disposition: A | Discharge status: Home / Self Care | Payer: Medicare Other | Attending: Physician Assistant | Admitting: Physician Assistant

## 2020-02-20 ENCOUNTER — Other Ambulatory Visit: Payer: Self-pay

## 2020-02-20 VITALS — BP 131/86 | HR 90 | Ht 70.0 in | Wt 210.0 lb

## 2020-02-20 DIAGNOSIS — N200 Calculus of kidney: Secondary | ICD-10-CM | POA: Diagnosis not present

## 2020-02-20 DIAGNOSIS — R972 Elevated prostate specific antigen [PSA]: Secondary | ICD-10-CM

## 2020-02-20 IMAGING — CR DG ABDOMEN 1V
1 series · 2 of 2 positions shown · non-contrast
Comparison: CT Abdomen and Pelvis 07/05/2019.

CLINICAL DATA: 69-year-old male with kidney stones.

EXAM:
ABDOMEN - 1 VIEW

[Series 1: dg abd 1 view · 0.14mm/px · 2 of 2 slices shown]
[im 1/2]
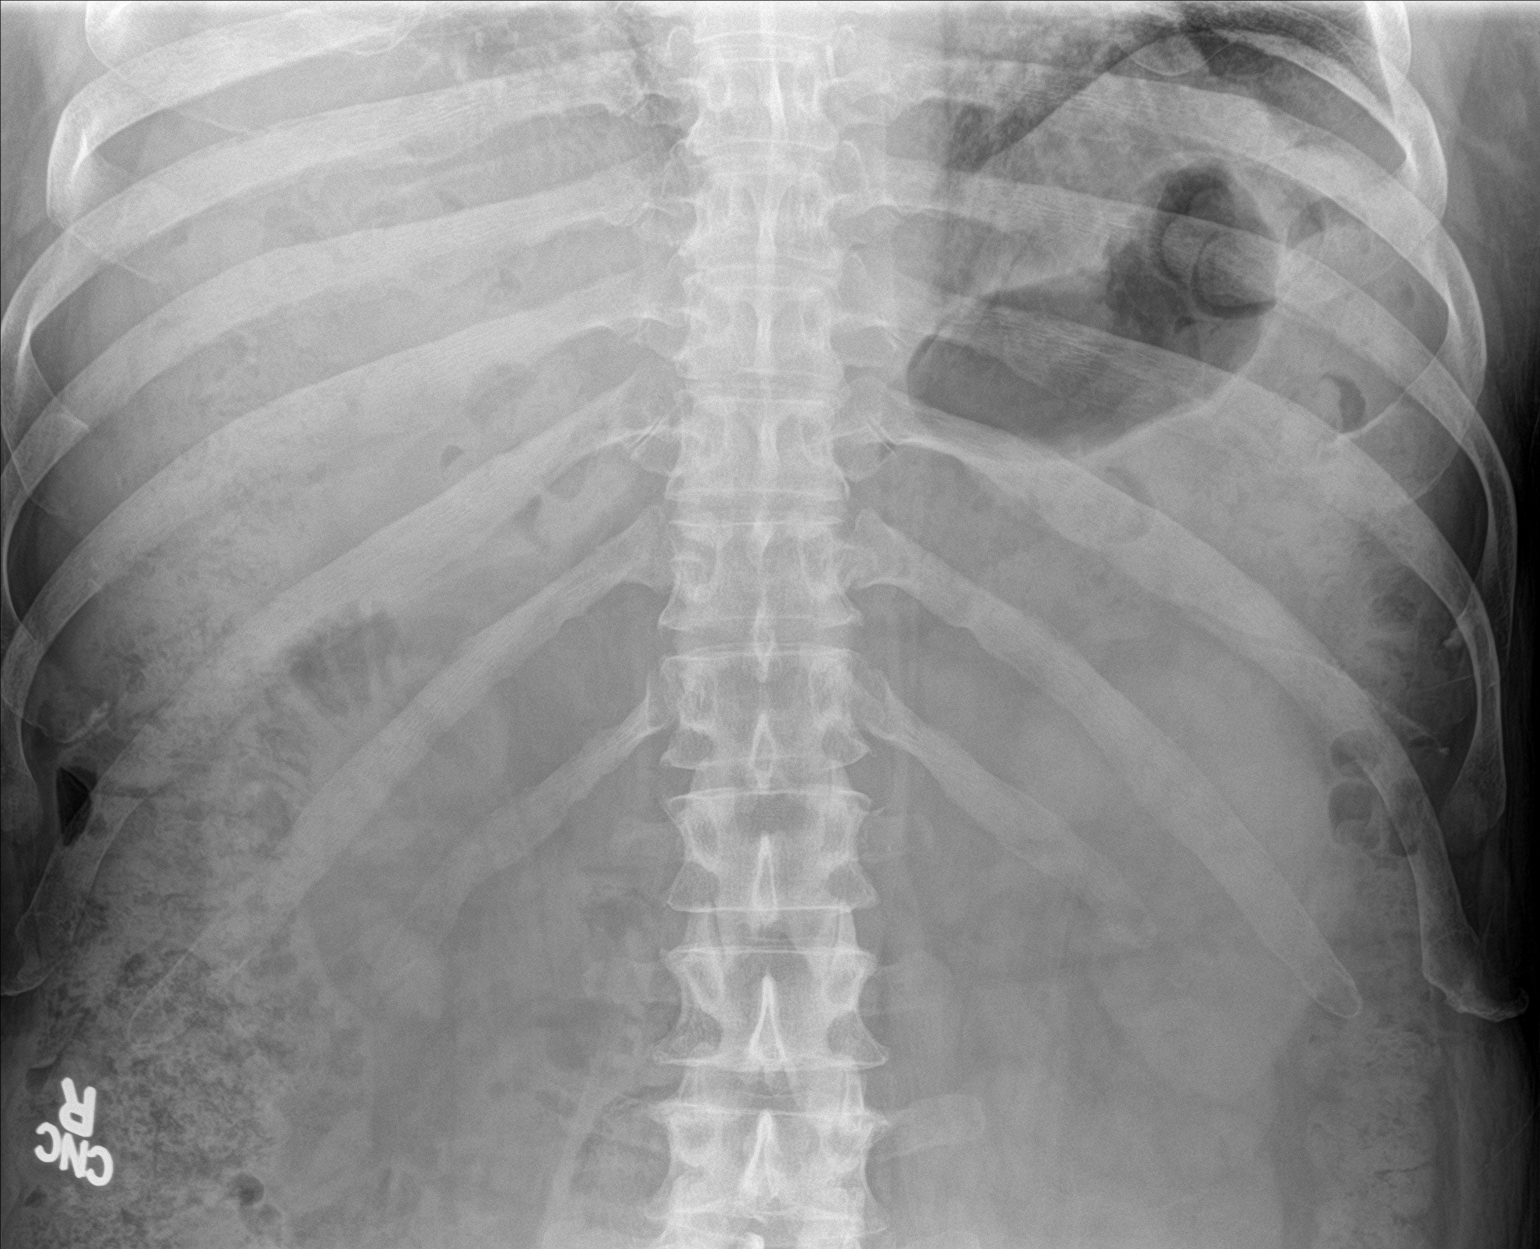
[im 2/2]
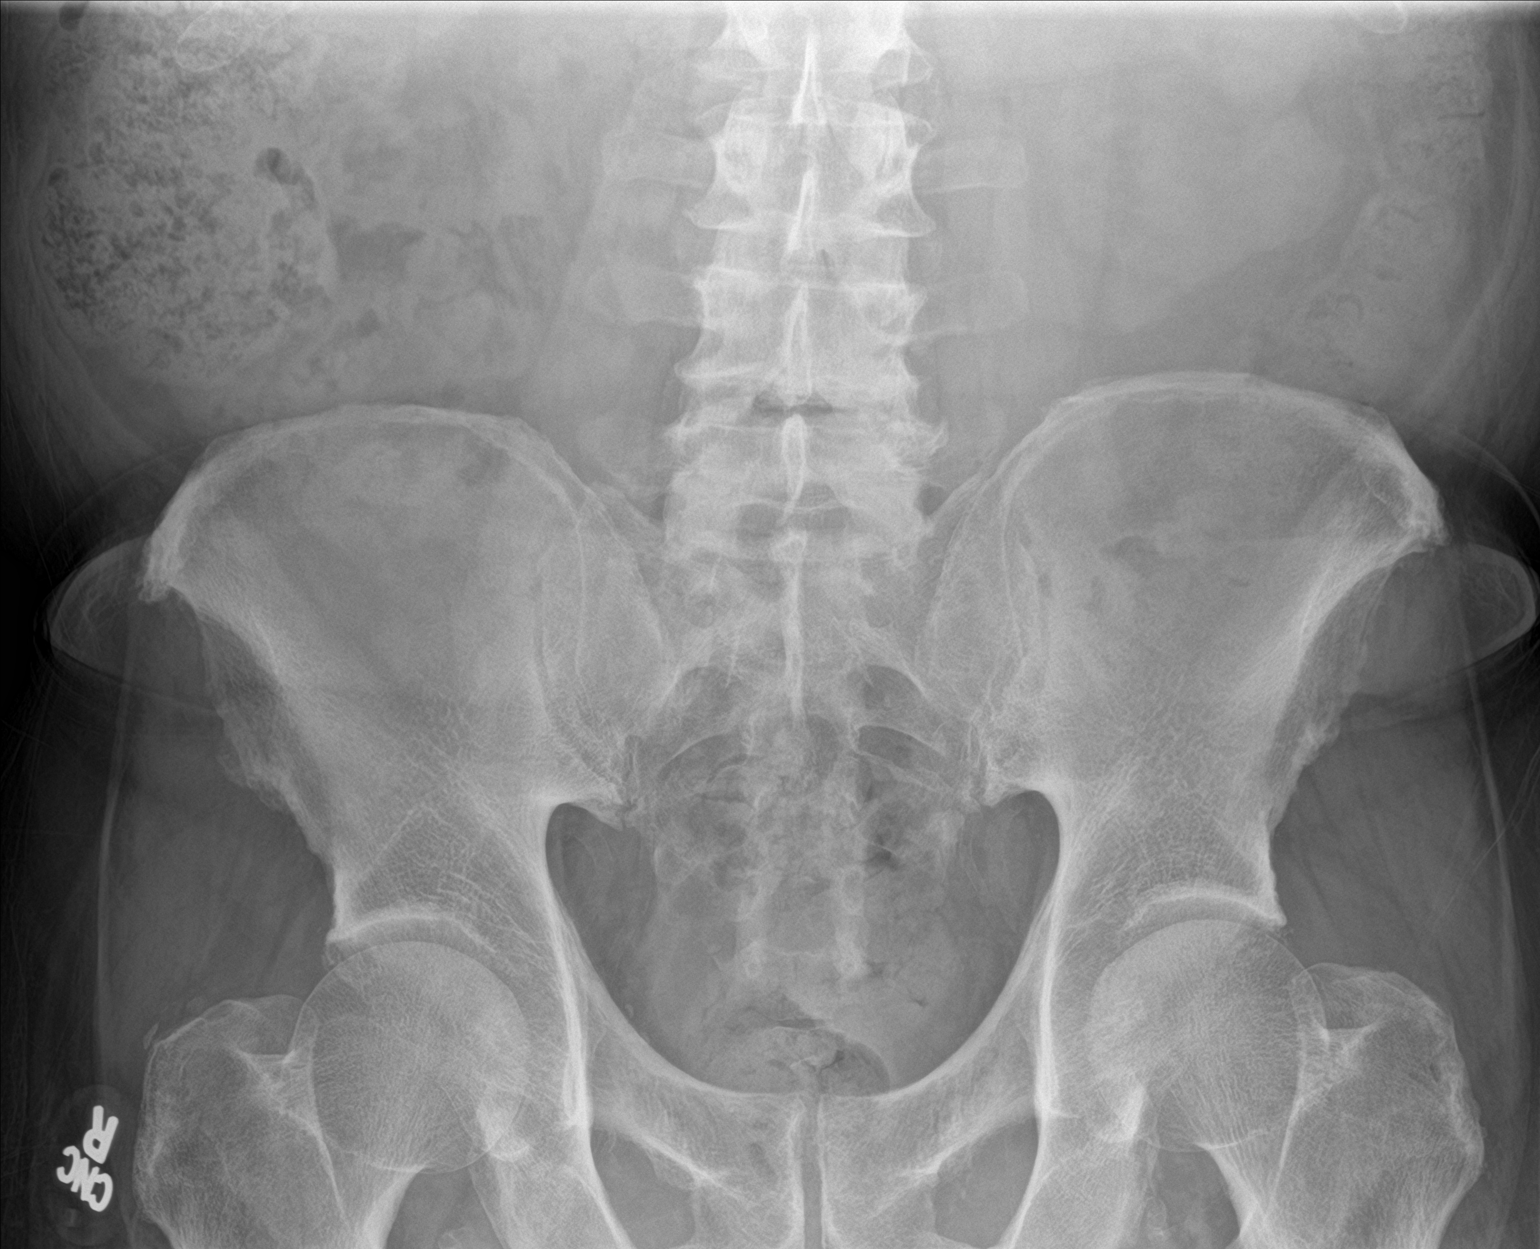

[2 of 2 positions shown; findings below may reference images not displayed]

FINDINGS: The bulky 14 mm calculus seen at the left renal pelvis in [REDACTED] is
no longer identified. The smaller 8 mm left lower pole calculus at
that time also is not identified. No urinary calculi are evident
radiographically.

Non obstructed bowel gas pattern. Negative visible left lung base.
No acute osseous abnormality identified. Chronic L4-L5 disc and
endplate degeneration.
IMPRESSION: The bulky left nephrolithiasis seen in [REDACTED] is no longer
identified. No urinary calculus evident today.

## 2020-02-20 NOTE — Patient Instructions (Signed)
I'll see you back in 3 months with a PSA draw prior to see if your PSA value has dropped now that you have completed wave therapy. Please refrain from bicycling or riding a tractor for 48 hours prior to your lab visit and attempt to refrain from ejaculation for 24 hours prior.

## 2020-02-20 NOTE — Progress Notes (Signed)
02/20/2020 9:01 AM   Jason Harrison 1951-01-03 [id number]  CC: Chief Complaint  Patient presents with   Nephrolithiasis    HPI: Jason Harrison is a 69 y.o. male with PMH DM 2, CAD s/p cardiac stent, nephrolithiasis s/p left URS/LL/stent for management of 2 left-sided stones in January 2021, and rising PSA who presents today for follow-up.  Notably, he started testosterone supplementation per his PCP for management of hypogonadism earlier this month.  KUB today reveals no apparent nephrolithiasis.  At his last visit with me on 08/19/2019, he reported rising PSA with plans for repeat testing in 6 months.  This has been completed.  PSA history as below. 01/22/2016 0.31 05/14/2018 0.49 08/03/2018 0.67 08/08/2019 2.81 (URS/LL/stent 3 weeks prior) 02/06/2020 2.17  He denies a history of finasteride use. No family history of prostate cancer. He reports having completed Wave therapy approximately 1.5-2 months ago for treatment of ED and believes they targeted treatment at both his penis and prostate. 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  PMH: Past Medical History:  Diagnosis Date   Diabetes mellitus without complication (HCC)    type 2   Diabetic nephropathy (HCC)    Diabetic retinopathy (HCC)    Myocardial infarction (HCC) 2016   ONE STENT    Surgical History: Past Surgical History:  Procedure Laterality Date   CARDIAC CATHETERIZATION N/A 01/15/2015   Procedure: Left Heart Cath and Coronary Angiography;  Surgeon: Dalia Heading, MD;  Location: [facility] INVASIVE CV LAB;  Service: Cardiovascular;  Laterality: N/A;   CARDIAC CATHETERIZATION N/A 01/15/2015   Procedure: Coronary Stent Intervention;  Surgeon: Iran Ouch, MD;  Location: [facility] INVASIVE CV LAB;  Service: Cardiovascular;  Laterality: N/A;   COLONOSCOPY WITH PROPOFOL N/A 04/17/2018   Procedure: COLONOSCOPY WITH PROPOFOL;  Surgeon: Christena Deem, MD;   Location: [facility] ENDOSCOPY;  Service: Endoscopy;  Laterality: N/A;   CORONARY ANGIOPLASTY     CYSTECTOMY  1972   CYSTOSCOPY WITH STENT PLACEMENT Left 07/11/2019   Procedure: CYSTOSCOPY WITH STENT PLACEMENT;  Surgeon: Vanna Scotland, MD;  Location: [facility] ORS;  Service: Urology;  Laterality: Left;   CYSTOSCOPY/URETEROSCOPY/HOLMIUM LASER/STENT PLACEMENT Left 07/22/2019   Procedure: CYSTOSCOPY/URETEROSCOPY/HOLMIUM LASER/STENT Exchange;  Surgeon: Vanna Scotland, MD;  Location: [facility] ORS;  Service: Urology;  Laterality: Left;   EYE SURGERY      Home Medications:  Allergies as of 02/20/2020      Reactions   Trulicity [dulaglutide] Itching, Swelling      Medication List       Accurate as of February 20, 2020 11:59 PM. If you have any questions, ask your nurse or doctor.        Alpha-Lipoic Acid 600 MG Caps Take 600 mg by mouth daily.   aspirin EC 81 MG tablet Take 81 mg by mouth every evening.   atorvastatin 20 MG tablet Commonly known as: LIPITOR Take 20 mg by mouth every evening.   B-12 5000 MCG Caps Take 5,000 mcg by mouth daily.   Biotin 22297 MCG Tabs Take 10,000 mcg by mouth daily.   brimonidine 0.2 % ophthalmic solution Commonly known as: ALPHAGAN Place 1 drop into the right eye 2 (two) times daily.   cetirizine 10 MG tablet Commonly known as: ZYRTEC Take 10 mg by mouth every morning.   CHROMIUM PICOLATE PO Take 5,000 mcg by mouth daily.   Fish Oil 1000 MG Caps Take 1,000 mg by mouth daily.  glimepiride 2 MG tablet Commonly known as: AMARYL TAKE 1 TABLET(2 MG) BY MOUTH DAILY WITH BREAKFAST What changed: Another medication with the same name was removed. Continue taking this medication, and follow the directions you see here. Changed by: Carman Ching, PA-C   HYDROcodone-acetaminophen 5-325 MG tablet Commonly known as: NORCO/VICODIN Take 1-2 tablets by mouth every 6 (six) hours as needed for moderate pain or severe pain.   ibuprofen 200 MG  tablet Commonly known as: ADVIL Take 200-600 mg by mouth every 6 (six) hours as needed for moderate pain.   Invokana 300 MG Tabs tablet Generic drug: canagliflozin Take 150 mg by mouth daily.   latanoprost 0.005 % ophthalmic solution Commonly known as: XALATAN Place 1 drop into the right eye at bedtime.   LUBRICATING EYE DROPS OP Place 1 drop into both eyes 2 (two) times daily.   melatonin 5 MG Tabs Take 2.5 mg by mouth at bedtime as needed (sleep).   metFORMIN 1000 MG tablet Commonly known as: GLUCOPHAGE Take 1,000 mg by mouth 2 (two) times daily.   multivitamin with minerals Tabs tablet Take 1 tablet by mouth daily.   ondansetron 4 MG disintegrating tablet Commonly known as: Zofran ODT Take 1 tablet (4 mg total) by mouth every 6 (six) hours as needed for nausea or vomiting.   OVER THE COUNTER MEDICATION Take 1 capsule by mouth daily. Saffron supplement   oxybutynin 5 MG tablet Commonly known as: DITROPAN Take 1 tablet (5 mg total) by mouth every 8 (eight) hours as needed for bladder spasms.   pioglitazone 15 MG tablet Commonly known as: ACTOS Take 15 mg by mouth daily.   PreserVision AREDS 2 Caps Take 1 capsule by mouth 2 (two) times daily.   Probiotic Caps Take 1 capsule by mouth daily.   tamsulosin 0.4 MG Caps capsule Commonly known as: Flomax Take 1 capsule (0.4 mg total) by mouth daily.   testosterone cypionate 100 MG/ML injection Commonly known as: DEPOTESTOTERONE CYPIONATE Inject into the muscle.   ticagrelor 90 MG Tabs tablet Commonly known as: BRILINTA Take 1 tablet (90 mg total) by mouth 2 (two) times daily.   TURMERIC PO Take 320 mg by mouth daily.   valsartan 160 MG tablet Commonly known as: DIOVAN Take 1 tablet (160 mg total) by mouth daily.   Victoza 18 MG/3ML Sopn Generic drug: liraglutide Inject 1.8 mg into the skin at bedtime.   vitamin C 1000 MG tablet Take 1,000 mg by mouth daily.   Vitamin D 50 MCG (2000 UT) tablet Take  2,000 Units by mouth daily.   VITAMIN K PO Take 1 capsule by mouth daily.       Allergies:  Allergies  Allergen Reactions   Trulicity [Dulaglutide] Itching and Swelling    Family History: No family history on file.  Social History:   reports that he has never smoked. He has never used smokeless tobacco. He reports that he does not drink alcohol and does not use drugs.  Physical Exam: BP 131/86    Pulse 90    Ht 5\' 10"  (1.778 m)    Wt 210 lb (95.3 kg)    BMI 30.13 kg/m   Constitutional:  Alert and oriented, no acute distress, nontoxic appearing HEENT: [place], AT Cardiovascular: No clubbing, cyanosis, or edema Respiratory: Normal respiratory effort, no increased work of breathing GU: Normal sphincter tone. Smooth, nontender, enlarged approximate 50+cc prostate without nodules or induration. Skin: No rashes, bruises or suspicious lesions Neurologic: Grossly intact, no focal deficits,  moving all 4 extremities Psychiatric: Normal mood and affect  Imaging: KUB, 02/20/2020: CLINICAL DATA:  69 year old male with kidney stones.  EXAM: ABDOMEN - 1 VIEW  COMPARISON:  CT Abdomen and Pelvis 07/05/2019.  FINDINGS: The bulky 14 mm calculus seen at the left renal pelvis in January is no longer identified. The smaller 8 mm left lower pole calculus at that time also is not identified. No urinary calculi are evident radiographically.  Non obstructed bowel gas pattern. Negative visible left lung base. No acute osseous abnormality identified. Chronic L4-L5 disc and endplate degeneration.  IMPRESSION: The bulky left nephrolithiasis seen in January is no longer identified. No urinary calculus evident today.   Electronically Signed   By: Odessa Fleming M.D.   On: 02/20/2020 16:23  I personally reviewed the imaging above and note no radiopaque nephrolithiasis.  Assessment & Plan:   1. Nephrolithiasis Resolved per KUB, no further intervention indicated.  2. Rising PSA level I had  a lengthy conversation with the patient regarding his recent PSA values.  I explained that while his PSA remains within normal limits for his age, the jump in values between 2020 and 2021 are significant and far greater than the threshold for concern of 0.35 ng/mL increase per year.  DRE with no significant findings today.  It is unclear if his recent completion of wave therapy has falsely elevated his most recent PSA value, and it is reasonable to assume that his recent ureteroscopy had falsely elevated his PSA value in March of this year.  Given this, and in shared decision-making with the patient, we have elected to repeat PSA testing in 3 months.    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  I explained that if his PSA remains consistent with or elevated over his past 2 readings at that point, I will recommend prostate biopsy at that time for further evaluation.  He expressed understanding. - PSA; Future   Return in about 3 months (around 05/22/2020) for Follow-up with PSA prior.  Carman Ching, PA-C  [facility] [address] [place], [place] [address] [phone]

## 2020-05-21 ENCOUNTER — Other Ambulatory Visit: Payer: Medicare Other

## 2020-05-21 ENCOUNTER — Other Ambulatory Visit: Payer: Self-pay

## 2020-05-21 DIAGNOSIS — R972 Elevated prostate specific antigen [PSA]: Secondary | ICD-10-CM

## 2020-05-22 LAB — PSA: Prostate Specific Ag, Serum: 0.7 ng/mL (ref 0.0–4.0)

## 2020-05-26 ENCOUNTER — Encounter: Payer: Self-pay | Admitting: Physician Assistant

## 2020-05-26 ENCOUNTER — Ambulatory Visit (INDEPENDENT_AMBULATORY_CARE_PROVIDER_SITE_OTHER): Payer: Medicare Other | Admitting: Physician Assistant

## 2020-05-26 ENCOUNTER — Other Ambulatory Visit: Payer: Self-pay

## 2020-05-26 VITALS — BP 137/85 | HR 89 | Ht 70.0 in | Wt 202.0 lb

## 2020-05-26 DIAGNOSIS — R972 Elevated prostate specific antigen [PSA]: Secondary | ICD-10-CM | POA: Diagnosis not present

## 2020-05-26 DIAGNOSIS — Z87442 Personal history of urinary calculi: Secondary | ICD-10-CM

## 2020-05-26 NOTE — Patient Instructions (Signed)
Happy Retirement!!!

## 2020-05-26 NOTE — Progress Notes (Signed)
05/26/2020 9:57 AM   Jason Harrison [birth year] [id number]  CC: Chief Complaint  Patient presents with  . Follow-up    HPI: Jason Harrison is a 69 y.o. male with PMH DM2, CAD s/p cardiac stent, nephrolithiasis s/p left URS/LL/stent for management of 2 left-sided stones in January 2021, hypogonadism on supplemental testosterone per his PCP, and rising PSA who presents today for PSA follow-up.    I saw him in clinic most recently on 02/20/2020 for stone and PSA follow-up.  He had had 2 PSA draws in 2021, both representing a significant increase over prior.  The first occurred 3 weeks following ureteroscopy, the second occurred within the setting of having recently completed wave therapy of the penis for treatment of ED.  DRE with no significant findings.  Today he reports feeling well.  He is anxious to learn the results of his repeat PSA.  PSA dated 05/21/2020 was 0.7, representing a return to baseline.  PSA history below.  01/22/2016        0.31 05/14/2018      0.49 08/03/2018        0.67 08/08/2019          2.81 (URS/LL/stent 3 weeks prior) 02/06/2020          2.17 (recent completion of Wave therapy of the penis and possibly prostate)  He denies dysuria, hesitancy, straining, intermittency, and weak stream.  He does report some mild urinary urgency associated with soft drinks.  He has minimal bother with this.  Patient is retiring for the second time in December 2021 and is excited to spend time taking care of himself.  He would like to continue with annual well visits in our clinic.  PMH: Past Medical History:  Diagnosis Date  . Diabetes mellitus without complication (HCC)    type 2  . Diabetic nephropathy (HCC)   . Diabetic retinopathy (HCC)   . Myocardial infarction [facility]) 2016   ONE STENT    Surgical History: Past Surgical History:  Procedure Laterality Date  . CARDIAC CATHETERIZATION N/A 01/15/2015   Procedure: Left Heart Cath and Coronary Angiography;  Surgeon: Dalia Heading, MD;  Location: [facility] INVASIVE CV LAB;  Service: Cardiovascular;  Laterality: N/A;  . CARDIAC CATHETERIZATION N/A 01/15/2015   Procedure: Coronary Stent Intervention;  Surgeon: Iran Ouch, MD;  Location: [facility] INVASIVE CV LAB;  Service: Cardiovascular;  Laterality: N/A;  . COLONOSCOPY WITH PROPOFOL N/A 04/17/2018   Procedure: COLONOSCOPY WITH PROPOFOL;  Surgeon: Christena Deem, MD;  Location[facility] ENDOSCOPY;  Service: Endoscopy;  Laterality: N/A;  . CORONARY ANGIOPLASTY    . CYSTECTOMY  1972  . CYSTOSCOPY WITH STENT PLACEMENT Left 07/11/2019   Procedure: CYSTOSCOPY WITH STENT PLACEMENT;  Surgeon: Vanna Scotland, MD;  Location: [facility] ORS;  Service: Urology;  Laterality: Left;  . CYSTOSCOPY/URETEROSCOPY/HOLMIUM LASER/STENT PLACEMENT Left 07/22/2019   Procedure: CYSTOSCOPY/URETEROSCOPY/HOLMIUM LASER/STENT Exchange;  Surgeon: Vanna Scotland, MD;  Location: [facility] ORS;  Service: Urology;  Laterality: Left;  . EYE SURGERY      Home Medications:  Allergies as of 05/26/2020      Reactions   Trulicity [dulaglutide] Itching, Swelling      Medication List       Accurate as of May 26, 2020  9:57 AM. If you have any questions, ask your nurse or doctor.        Alpha-Lipoic Acid 600 MG Caps Take 600 mg by mouth daily.   aspirin EC 81 MG tablet Take 81 mg by mouth every  evening.   atorvastatin 20 MG tablet Commonly known as: LIPITOR Take 20 mg by mouth every evening.   B-12 5000 MCG Caps Take 5,000 mcg by mouth daily.   BD Pen Needle Nano 2nd Gen 32G X 4 MM Misc Generic drug: Insulin Pen Needle USE 1 PEN NEEDLE ONCE DAILY   Biotin 99357 MCG Tabs Take 10,000 mcg by mouth daily.   brimonidine 0.2 % ophthalmic solution Commonly known as: ALPHAGAN Place 1 drop into the right eye 2 (two) times daily.   cetirizine 10 MG tablet Commonly known as: ZYRTEC Take 10 mg by mouth every morning.   CHROMIUM PICOLATE PO Take 5,000 mcg by mouth daily.   dorzolamide-timolol  22.3-6.8 MG/ML ophthalmic solution Commonly known as: COSOPT Place 1 drop into the right eye 2 (two) times daily.   Fish Oil 1000 MG Caps Take 1,000 mg by mouth daily.   fluorometholone 0.1 % ophthalmic suspension Commonly known as: FML 1 drop daily.   glimepiride 2 MG tablet Commonly known as: AMARYL TAKE 1 TABLET(2 MG) BY MOUTH DAILY WITH BREAKFAST   HYDROcodone-acetaminophen 5-325 MG tablet Commonly known as: NORCO/VICODIN Take 1-2 tablets by mouth every 6 (six) hours as needed for moderate pain or severe pain.   ibuprofen 200 MG tablet Commonly known as: ADVIL Take 200-600 mg by mouth every 6 (six) hours as needed for moderate pain.   Invokana 300 MG Tabs tablet Generic drug: canagliflozin Take 150 mg by mouth daily.   latanoprost 0.005 % ophthalmic solution Commonly known as: XALATAN Place 1 drop into the right eye at bedtime.   LUBRICATING EYE DROPS OP Place 1 drop into both eyes 2 (two) times daily.   melatonin 5 MG Tabs Take 2.5 mg by mouth at bedtime as needed (sleep).   metFORMIN 1000 MG tablet Commonly known as: GLUCOPHAGE Take 1,000 mg by mouth 2 (two) times daily.   multivitamin with minerals Tabs tablet Take 1 tablet by mouth daily.   ondansetron 4 MG disintegrating tablet Commonly known as: Zofran ODT Take 1 tablet (4 mg total) by mouth every 6 (six) hours as needed for nausea or vomiting.   OVER THE COUNTER MEDICATION Take 1 capsule by mouth daily. Saffron supplement   oxybutynin 5 MG tablet Commonly known as: DITROPAN Take 1 tablet (5 mg total) by mouth every 8 (eight) hours as needed for bladder spasms.   pioglitazone 15 MG tablet Commonly known as: ACTOS Take 15 mg by mouth daily.   PreserVision AREDS 2 Caps Take 1 capsule by mouth 2 (two) times daily.   Probiotic Caps Take 1 capsule by mouth daily.   tamsulosin 0.4 MG Caps capsule Commonly known as: Flomax Take 1 capsule (0.4 mg total) by mouth daily.   testosterone cypionate  100 MG/ML injection Commonly known as: DEPOTESTOTERONE CYPIONATE Inject into the muscle.   ticagrelor 90 MG Tabs tablet Commonly known as: BRILINTA Take 1 tablet (90 mg total) by mouth 2 (two) times daily.   TURMERIC PO Take 320 mg by mouth daily.   valsartan 160 MG tablet Commonly known as: DIOVAN Take 1 tablet (160 mg total) by mouth daily.   Victoza 18 MG/3ML Sopn Generic drug: liraglutide Inject 1.8 mg into the skin at bedtime.   vitamin C 1000 MG tablet Take 1,000 mg by mouth daily.   Vitamin D 50 MCG (2000 UT) tablet Take 2,000 Units by mouth daily.   VITAMIN K PO Take 1 capsule by mouth daily.       Allergies:  Allergies  Allergen Reactions  . Trulicity [Dulaglutide] Itching and Swelling    Family History: No family history on file.  Social History:   reports that he has never smoked. He has never used smokeless tobacco. He reports that he does not drink alcohol and does not use drugs.  Physical Exam: BP 137/85 (BP Location: Left Arm, Patient Position: Sitting, Cuff Size: Large)   Pulse 89   Ht 5\' 10"  (1.778 m)   Wt 202 lb (91.6 kg)   BMI 28.98 kg/m   Constitutional:  Alert and oriented, no acute distress, nontoxic appearing HEENT: [place], AT Cardiovascular: No clubbing, cyanosis, or edema Respiratory: Normal respiratory effort, no increased work of breathing Skin: No rashes, bruises or suspicious lesions Neurologic: Grossly intact, no focal deficits, moving all 4 extremities Psychiatric: Normal mood and affect  Laboratory Data: Results for orders placed or performed in visit on 05/21/20  PSA  Result Value Ref Range   Prostate Specific Ag, Serum 0.7 0.0 - 4.0 ng/mL   Assessment & Plan:   1. Rising PSA level PSA return to baseline.  We will continue with annual PSA monitoring and DRE's.  No need for further evaluation at this time.  Patient is in agreement with this plan. - PSA; Future  2. History of nephrolithiasis No stone symptoms in the  interim.  We will plan for annual KUB for stone monitoring as well. - DG Abd 1 View; Future  Return in about 1 year (around 05/26/2021) for Annual DRE/IPSS/PVR with PSA and KUB prior.  05/28/2021, PA-C  [facility] [address] [place], [place] [id number]

## 2020-11-11 ENCOUNTER — Other Ambulatory Visit: Payer: Self-pay

## 2020-11-11 ENCOUNTER — Ambulatory Visit (INDEPENDENT_AMBULATORY_CARE_PROVIDER_SITE_OTHER): Payer: Medicare Other | Admitting: Dermatology

## 2020-11-11 DIAGNOSIS — D692 Other nonthrombocytopenic purpura: Secondary | ICD-10-CM

## 2020-11-11 DIAGNOSIS — L570 Actinic keratosis: Secondary | ICD-10-CM

## 2020-11-11 DIAGNOSIS — L578 Other skin changes due to chronic exposure to nonionizing radiation: Secondary | ICD-10-CM

## 2020-11-11 NOTE — Progress Notes (Signed)
   New Patient Visit  Subjective  Jason Harrison is a 70 y.o. male who presents for the following: Skin Problem (Check rough patches on the ears, pt pick with these areas/ ).  He has other spots and lesions to be checked today.  The following portions of the chart were reviewed this encounter and updated as appropriate:   Tobacco  Allergies  Meds  Problems  Med Hx  Surg Hx  Fam Hx     Review of Systems:  No other skin or systemic complaints except as noted in HPI or Assessment and Plan.  Objective  Well appearing patient in no apparent distress; mood and affect are within normal limits.  A focused examination was performed including face,arms. Relevant physical exam findings are noted in the Assessment and Plan.  Objective  Right Ear (3): Erythematous thin papules/macules with gritty scale.    Assessment & Plan  AK (actinic keratosis) (3) Right Ear  Destruction of lesion - Right Ear Complexity: simple   Destruction method: cryotherapy   Informed consent: discussed and consent obtained   Timeout:  patient name, date of birth, surgical site, and procedure verified Lesion destroyed using liquid nitrogen: Yes   Region frozen until ice ball extended beyond lesion: Yes   Outcome: patient tolerated procedure well with no complications   Post-procedure details: wound care instructions given     Actinic Damage - chronic, secondary to cumulative UV radiation exposure/sun exposure over time - diffuse scaly erythematous macules with underlying dyspigmentation - Recommend daily broad spectrum sunscreen SPF 30+ to sun-exposed areas, reapply every 2 hours as needed.  - Recommend staying in the shade or wearing long sleeves, sun glasses (UVA+UVB protection) and wide brim hats (4-inch brim around the entire circumference of the hat). - Call for new or changing lesions.  Purpura - Chronic; persistent and recurrent.  Treatable, but not curable. - Violaceous macules and patches -  Benign - Related to trauma, age, sun damage and/or use of blood thinners, chronic use of topical and/or oral steroids - Observe - Can use OTC arnica containing moisturizer such as Dermend Bruise Formula if desired - Call for worsening or other concerns  Return in about 3 months (around 02/11/2021) for Aks.  IAngelique Holm, CMA, am acting as scribe for Armida Sans, MD .  Documentation: I have reviewed the above documentation for accuracy and completeness, and I agree with the above.  Armida Sans, MD

## 2020-11-11 NOTE — Patient Instructions (Signed)

## 2020-11-17 ENCOUNTER — Encounter: Payer: Self-pay | Admitting: Dermatology

## 2021-02-15 ENCOUNTER — Other Ambulatory Visit: Payer: Self-pay | Admitting: Internal Medicine

## 2021-02-15 DIAGNOSIS — G8929 Other chronic pain: Secondary | ICD-10-CM

## 2021-02-15 DIAGNOSIS — M5442 Lumbago with sciatica, left side: Secondary | ICD-10-CM

## 2021-02-16 ENCOUNTER — Other Ambulatory Visit: Payer: Self-pay

## 2021-02-16 ENCOUNTER — Ambulatory Visit (INDEPENDENT_AMBULATORY_CARE_PROVIDER_SITE_OTHER): Payer: Medicare Other | Admitting: Dermatology

## 2021-02-16 ENCOUNTER — Encounter: Payer: Self-pay | Admitting: Dermatology

## 2021-02-16 DIAGNOSIS — D692 Other nonthrombocytopenic purpura: Secondary | ICD-10-CM | POA: Diagnosis not present

## 2021-02-16 DIAGNOSIS — L821 Other seborrheic keratosis: Secondary | ICD-10-CM | POA: Diagnosis not present

## 2021-02-16 DIAGNOSIS — L820 Inflamed seborrheic keratosis: Secondary | ICD-10-CM

## 2021-02-16 DIAGNOSIS — L578 Other skin changes due to chronic exposure to nonionizing radiation: Secondary | ICD-10-CM | POA: Diagnosis not present

## 2021-02-16 DIAGNOSIS — L570 Actinic keratosis: Secondary | ICD-10-CM

## 2021-02-16 NOTE — Progress Notes (Signed)
   Follow-Up Visit   Subjective  Jason Harrison is a 70 y.o. male who presents for the following: Actinic Keratosis (Of the face and ears - recheck today, previously tx with LN2.).  The following portions of the chart were reviewed this encounter and updated as appropriate:   Tobacco  Allergies  Meds  Problems  Med Hx  Surg Hx  Fam Hx     Review of Systems:  No other skin or systemic complaints except as noted in HPI or Assessment and Plan.  Objective  Well appearing patient in no apparent distress; mood and affect are within normal limits.  A focused examination was performed including the face, arms, heads, and ears. Relevant physical exam findings are noted in the Assessment and Plan.  L ear x 5, R ear x 1 (6) Erythematous thin papules/macules with gritty scale.   L hand x 2 (2) Erythematous keratotic or waxy stuck-on papule or plaque.   Assessment & Plan  AK (actinic keratosis) (6) L ear x 5, R ear x 1  Destruction of lesion - L ear x 5, R ear x 1 Complexity: simple   Destruction method: cryotherapy   Informed consent: discussed and consent obtained   Timeout:  patient name, date of birth, surgical site, and procedure verified Lesion destroyed using liquid nitrogen: Yes   Region frozen until ice ball extended beyond lesion: Yes   Outcome: patient tolerated procedure well with no complications   Post-procedure details: wound care instructions given    Inflamed seborrheic keratosis L hand x 2  Destruction of lesion - L hand x 2 Complexity: simple   Destruction method: cryotherapy   Informed consent: discussed and consent obtained   Timeout:  patient name, date of birth, surgical site, and procedure verified Lesion destroyed using liquid nitrogen: Yes   Region frozen until ice ball extended beyond lesion: Yes   Outcome: patient tolerated procedure well with no complications   Post-procedure details: wound care instructions given    Actinic Damage - chronic,  secondary to cumulative UV radiation exposure/sun exposure over time - diffuse scaly erythematous macules with underlying dyspigmentation - Recommend daily broad spectrum sunscreen SPF 30+ to sun-exposed areas, reapply every 2 hours as needed.  - Recommend staying in the shade or wearing long sleeves, sun glasses (UVA+UVB protection) and wide brim hats (4-inch brim around the entire circumference of the hat). - Call for new or changing lesions.  Seborrheic Keratoses - Stuck-on, waxy, tan-brown papules and/or plaques  - Benign-appearing - Discussed benign etiology and prognosis. - Observe - Call for any changes  Purpura - Chronic; persistent and recurrent.  Treatable, but not curable. - Violaceous macules and patches - Benign - Related to trauma, age, sun damage and/or use of blood thinners, chronic use of topical and/or oral steroids - Observe - Can use OTC arnica containing moisturizer such as Dermend Bruise Formula if desired - Call for worsening or other concerns  Return in about 6 months (around 08/19/2021) for AK follow up .  Maylene Roes, CMA, am acting as scribe for Armida Sans, MD . Documentation: I have reviewed the above documentation for accuracy and completeness, and I agree with the above.  Armida Sans, MD

## 2021-02-16 NOTE — Patient Instructions (Signed)

## 2021-02-18 ENCOUNTER — Ambulatory Visit: Payer: Medicare Other

## 2021-02-21 ENCOUNTER — Encounter: Payer: Self-pay | Admitting: Dermatology

## 2021-03-03 ENCOUNTER — Other Ambulatory Visit: Payer: Self-pay

## 2021-03-03 ENCOUNTER — Ambulatory Visit
Admission: RE | Admit: 2021-03-03 | Discharge: 2021-03-03 | Disposition: A | Discharge status: Home / Self Care | Payer: Medicare Other | Source: Ambulatory Visit | Attending: Internal Medicine | Admitting: Internal Medicine

## 2021-03-03 DIAGNOSIS — M5442 Lumbago with sciatica, left side: Secondary | ICD-10-CM | POA: Insufficient documentation

## 2021-03-03 DIAGNOSIS — M5441 Lumbago with sciatica, right side: Secondary | ICD-10-CM | POA: Insufficient documentation

## 2021-03-03 DIAGNOSIS — G8929 Other chronic pain: Secondary | ICD-10-CM | POA: Diagnosis present

## 2021-03-03 IMAGING — MR MR LUMBAR SPINE WO/W CM
6 of 7 series · 30 of 48 positions shown · IV contrast (9ml Gadavist)
Comparison: None.

CLINICAL DATA: Two-month history of low back and bilateral leg
pain.

EXAM:
MRI LUMBAR SPINE WITHOUT AND WITH CONTRAST
TECHNIQUE: Multiplanar and multiecho pulse sequences of the lumbar spine were
obtained without and with intravenous contrast.
CONTRAST:  9mL GADAVIST GADOBUTROL 1 MMOL/ML IV SOLN

[Series 5: T2 · sagittal · 4.0mm · 0.88mm/px · 4 of 17 slices shown (1 of 2)]
[im 1/17]
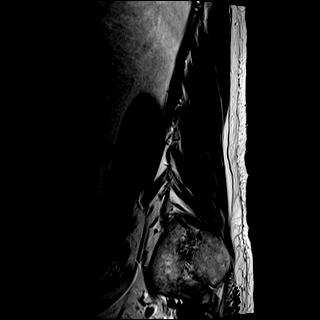
[im 6/17]
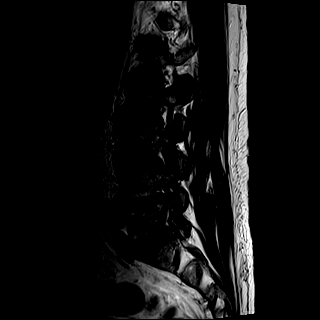
[im 11/17]
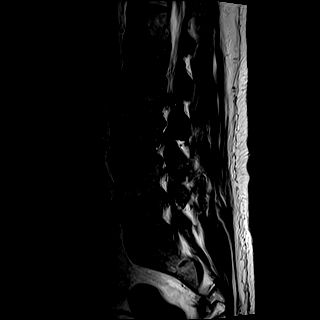
[im 17/17]
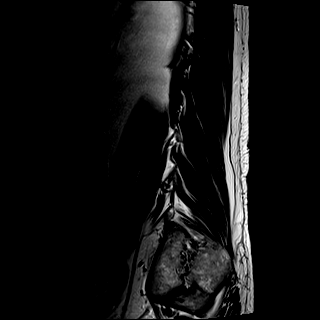

[Series 6: T1 · sagittal · 4.0mm · 0.88mm/px · 4 of 17 slices shown (1 of 2)]
[im 1/17]
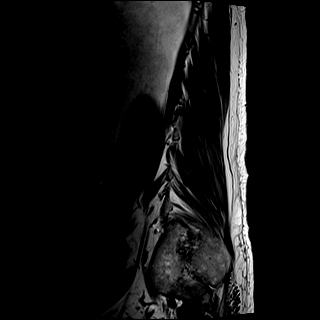
[im 6/17]
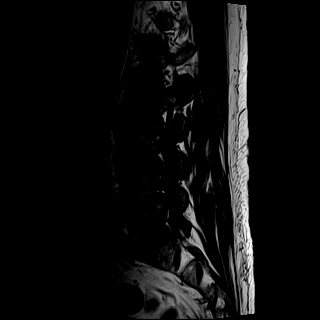
[im 11/17]
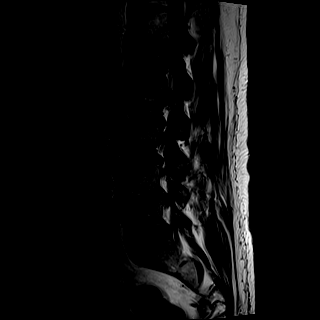
[im 17/17]
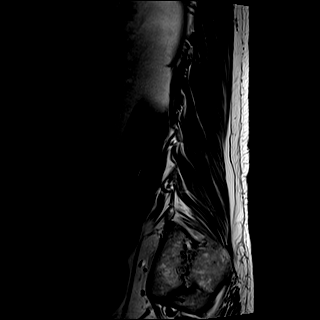

[Series 7: STIR · sagittal · 4.0mm · 0.44mm/px · 1 of 17 slices shown]
[im 1/17]
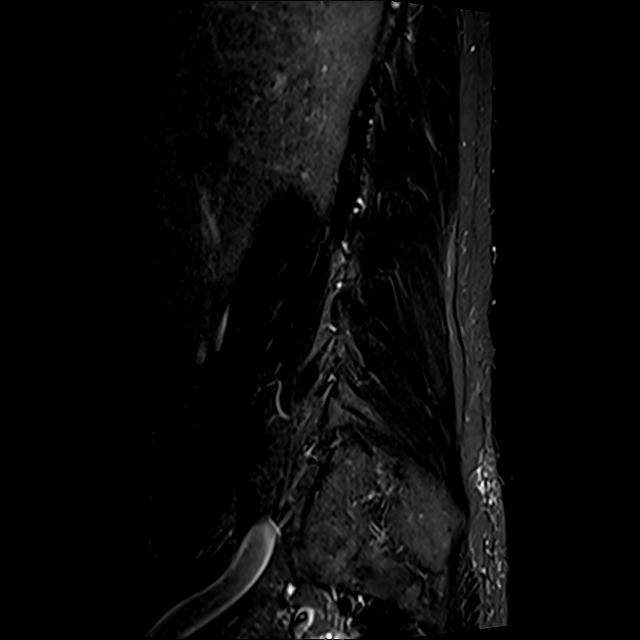

[Series 8: T2 · axial · 4.0mm · 0.78mm/px · z∈[-77,+140]mm · 8 of 37 slices shown (2 of 2)]
[im 1/37]
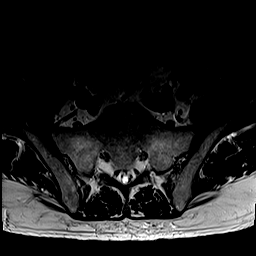
[im 5/37]
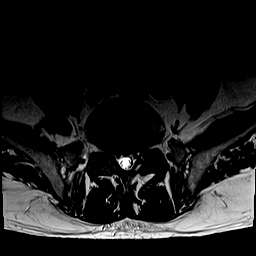
[im 13/37]
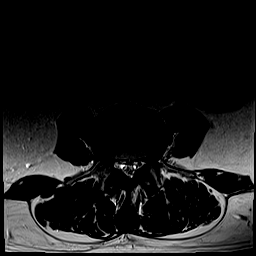
[im 17/37]
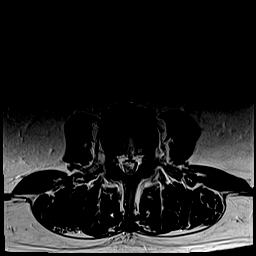
[im 21/37]
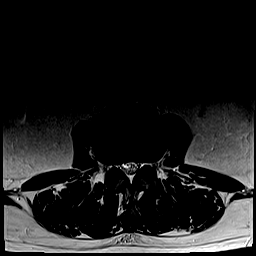
[im 25/37]
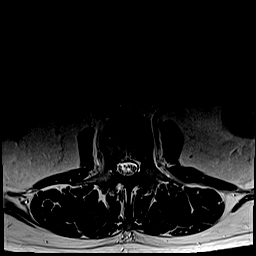
[im 33/37]
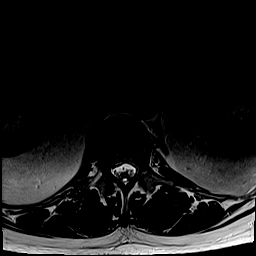
[im 37/37]
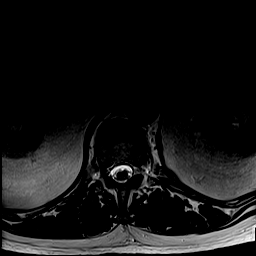

[Series 9: T1 · axial · 4.0mm · 0.39mm/px · z∈[-77,+140]mm · 8 of 37 slices shown (2 of 2)]
[im 1/37]
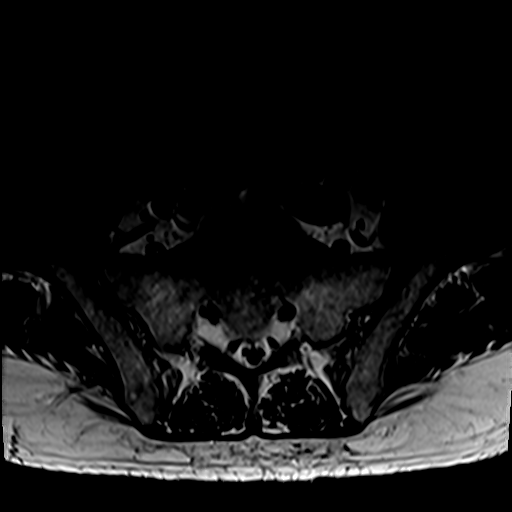
[im 5/37]
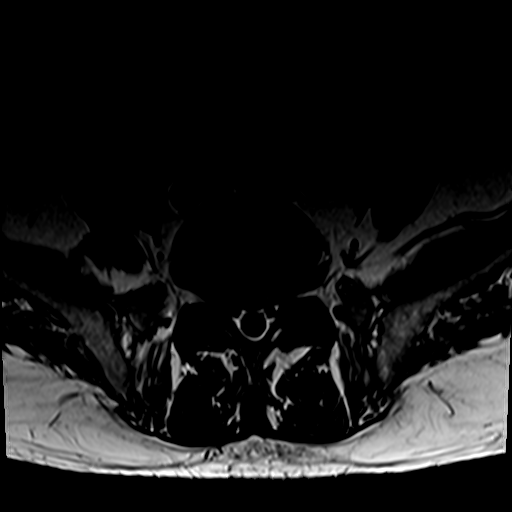
[im 13/37]
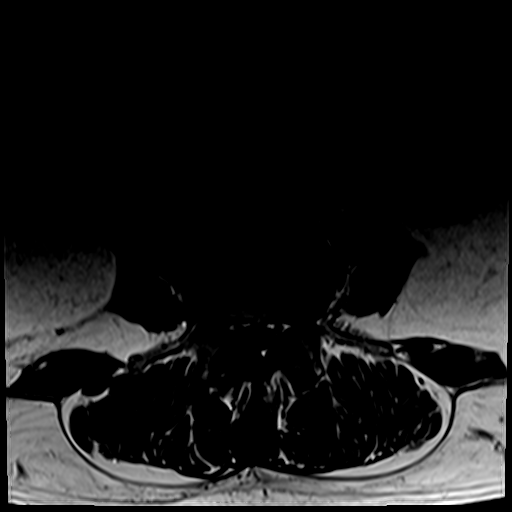
[im 17/37]
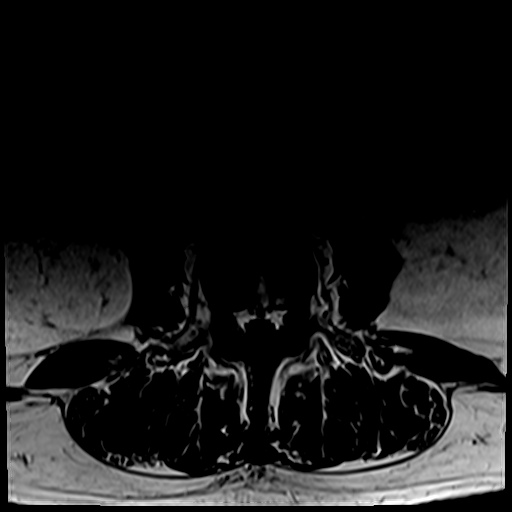
[im 21/37]
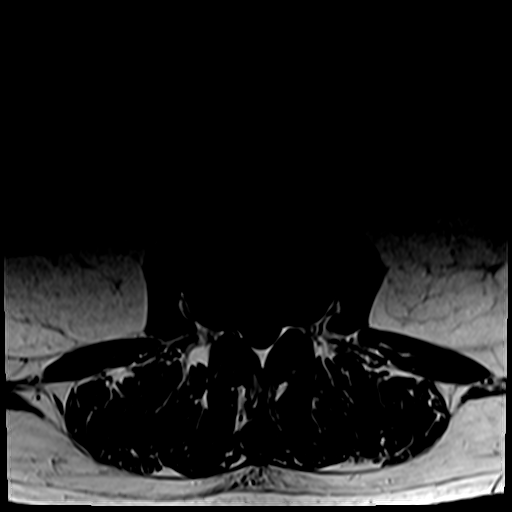
[im 25/37]
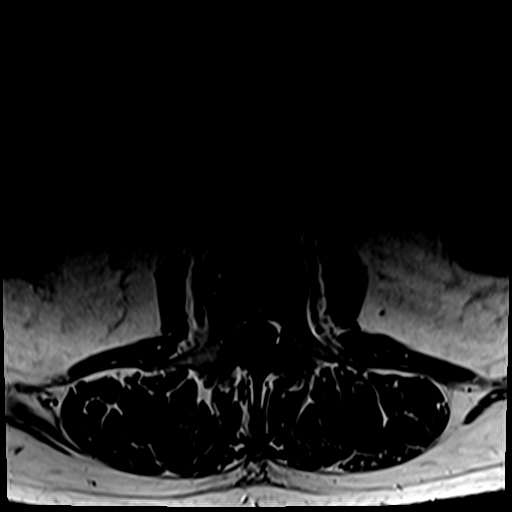
[im 33/37]
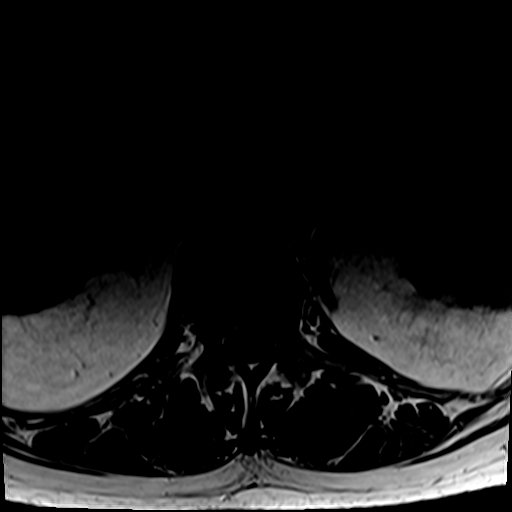
[im 37/37]
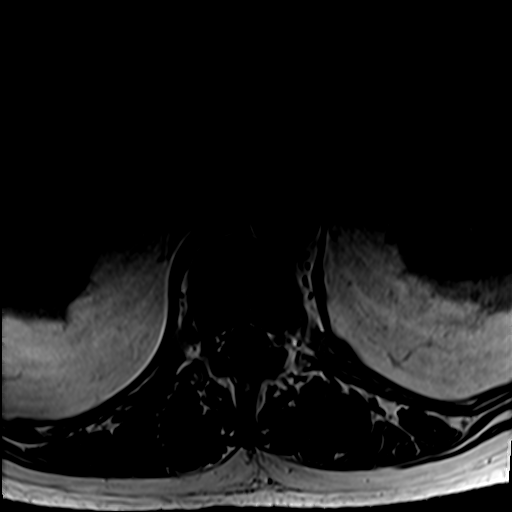

[Series 10: T1 fat-sat post-contrast · sagittal · 4.0mm · 0.88mm/px · 5 of 17 slices shown]
[im 1/17]
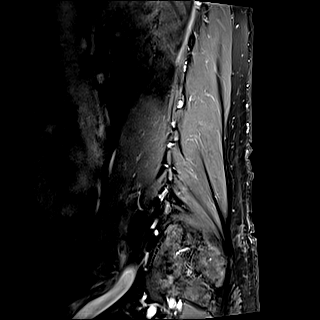
[im 5/17]
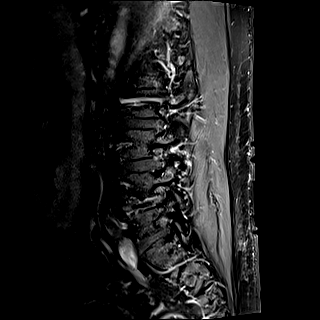
[im 9/17]
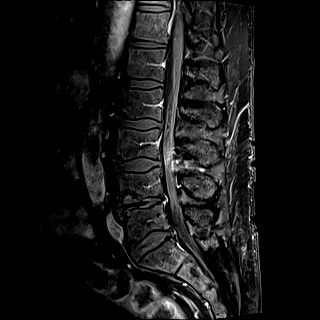
[im 13/17]
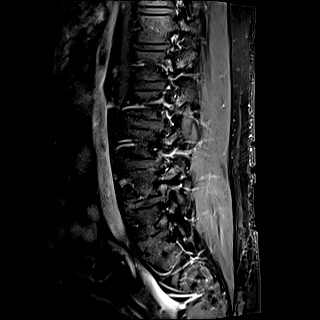
[im 17/17]
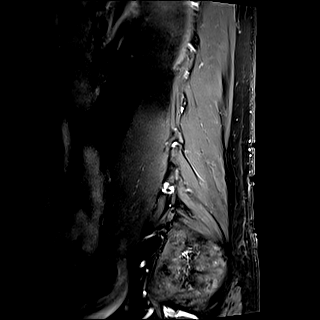

[30 of 48 positions shown; findings below may reference images not displayed]

FINDINGS: Segmentation: There are five lumbar type vertebral bodies. The last
full intervertebral disc space is labeled L5-S1.

Alignment: Normal overall alignment of the lumbar vertebral bodies.
The facets are normally aligned. No pars defects.

Vertebrae: Mild endplate reactive changes but no bone lesions or
fractures. Prominent L4 Schmorl's node noted.

Conus medullaris and cauda equina: Conus extends to the L1 level.
Conus and cauda equina appear normal.

Paraspinal and other soft tissues: No significant paraspinal or
retroperitoneal findings.

Disc levels:

T12-L1: Small focal right paracentral disc protrusion with mild mass
effect on right side of the thecal sac.

L1-2: Mild annular bulge and mild facet disease but no disc
protrusions, spinal or foraminal stenosis.

L2-3: Bulging annulus and mild facet disease with slight flattening
of the ventral thecal sac and mild right lateral recess
encroachment. No significant spinal or foraminal stenosis.

L3-4: Bulging degenerated annulus, short pedicles, moderate facet
disease and ligamentum flavum thickening contributing to moderate
spinal and bilateral lateral recess stenosis. No foraminal stenosis.

L4-5: Bulging degenerated annulus, osteophytic ridging and facet
disease contributing to mild to moderate spinal and bilateral
lateral recess stenosis and mild bilateral foraminal stenosis.

L5-S1: Bulging degenerated annulus and moderate facet disease but no
focal disc protrusions, significant spinal or foraminal stenosis.
IMPRESSION: 1. Degenerative lumbar spondylosis with multilevel disc disease and
facet disease.
2. Moderate spinal and bilateral lateral recess stenosis at L3-4.
3. Mild to moderate spinal and bilateral lateral recess stenosis and
mild bilateral foraminal stenosis at L4-5.
4. Small focal right paracentral disc protrusion at T12-L1.

## 2021-03-03 MED ORDER — GADOBUTROL 1 MMOL/ML IV SOLN
9.0000 mL | Freq: Once | INTRAVENOUS | Status: AC | PRN
  Administered 2021-03-03: 9 mL via INTRAVENOUS

## 2021-05-31 ENCOUNTER — Other Ambulatory Visit: Payer: Self-pay

## 2021-05-31 ENCOUNTER — Other Ambulatory Visit: Payer: Medicare Other

## 2021-05-31 DIAGNOSIS — R972 Elevated prostate specific antigen [PSA]: Secondary | ICD-10-CM

## 2021-06-01 LAB — PSA: Prostate Specific Ag, Serum: 1 ng/mL (ref 0.0–4.0)

## 2021-06-02 ENCOUNTER — Ambulatory Visit: Payer: Medicare Other | Admitting: Physician Assistant

## 2021-06-04 ENCOUNTER — Encounter: Payer: Self-pay | Admitting: Physician Assistant

## 2021-06-21 ENCOUNTER — Ambulatory Visit (INDEPENDENT_AMBULATORY_CARE_PROVIDER_SITE_OTHER): Payer: Medicare Other | Admitting: Physician Assistant

## 2021-06-21 ENCOUNTER — Encounter: Payer: Self-pay | Admitting: Physician Assistant

## 2021-06-21 ENCOUNTER — Ambulatory Visit
Admission: RE | Admit: 2021-06-21 | Discharge: 2021-06-21 | Disposition: A | Discharge status: Home / Self Care | Payer: Medicare Other | Source: Ambulatory Visit | Attending: Physician Assistant | Admitting: Physician Assistant

## 2021-06-21 ENCOUNTER — Other Ambulatory Visit: Payer: Self-pay

## 2021-06-21 ENCOUNTER — Other Ambulatory Visit: Payer: Self-pay | Admitting: Physician Assistant

## 2021-06-21 VITALS — BP 131/82 | HR 92 | Ht 70.0 in | Wt 202.0 lb

## 2021-06-21 DIAGNOSIS — Z87442 Personal history of urinary calculi: Secondary | ICD-10-CM | POA: Diagnosis present

## 2021-06-21 DIAGNOSIS — N529 Male erectile dysfunction, unspecified: Secondary | ICD-10-CM

## 2021-06-21 DIAGNOSIS — E291 Testicular hypofunction: Secondary | ICD-10-CM

## 2021-06-21 DIAGNOSIS — N400 Enlarged prostate without lower urinary tract symptoms: Secondary | ICD-10-CM

## 2021-06-21 DIAGNOSIS — R972 Elevated prostate specific antigen [PSA]: Secondary | ICD-10-CM

## 2021-06-21 LAB — BLADDER SCAN AMB NON-IMAGING

## 2021-06-21 IMAGING — CR DG ABDOMEN 1V
1 series · 2 of 2 positions shown · non-contrast
Comparison: 02/17/2020

CLINICAL DATA: Nephrolithiasis

EXAM:
ABDOMEN - 1 VIEW

[Series 1: dg abd 1 view · 0.14mm/px · 2 of 2 slices shown]
[im 1/2]
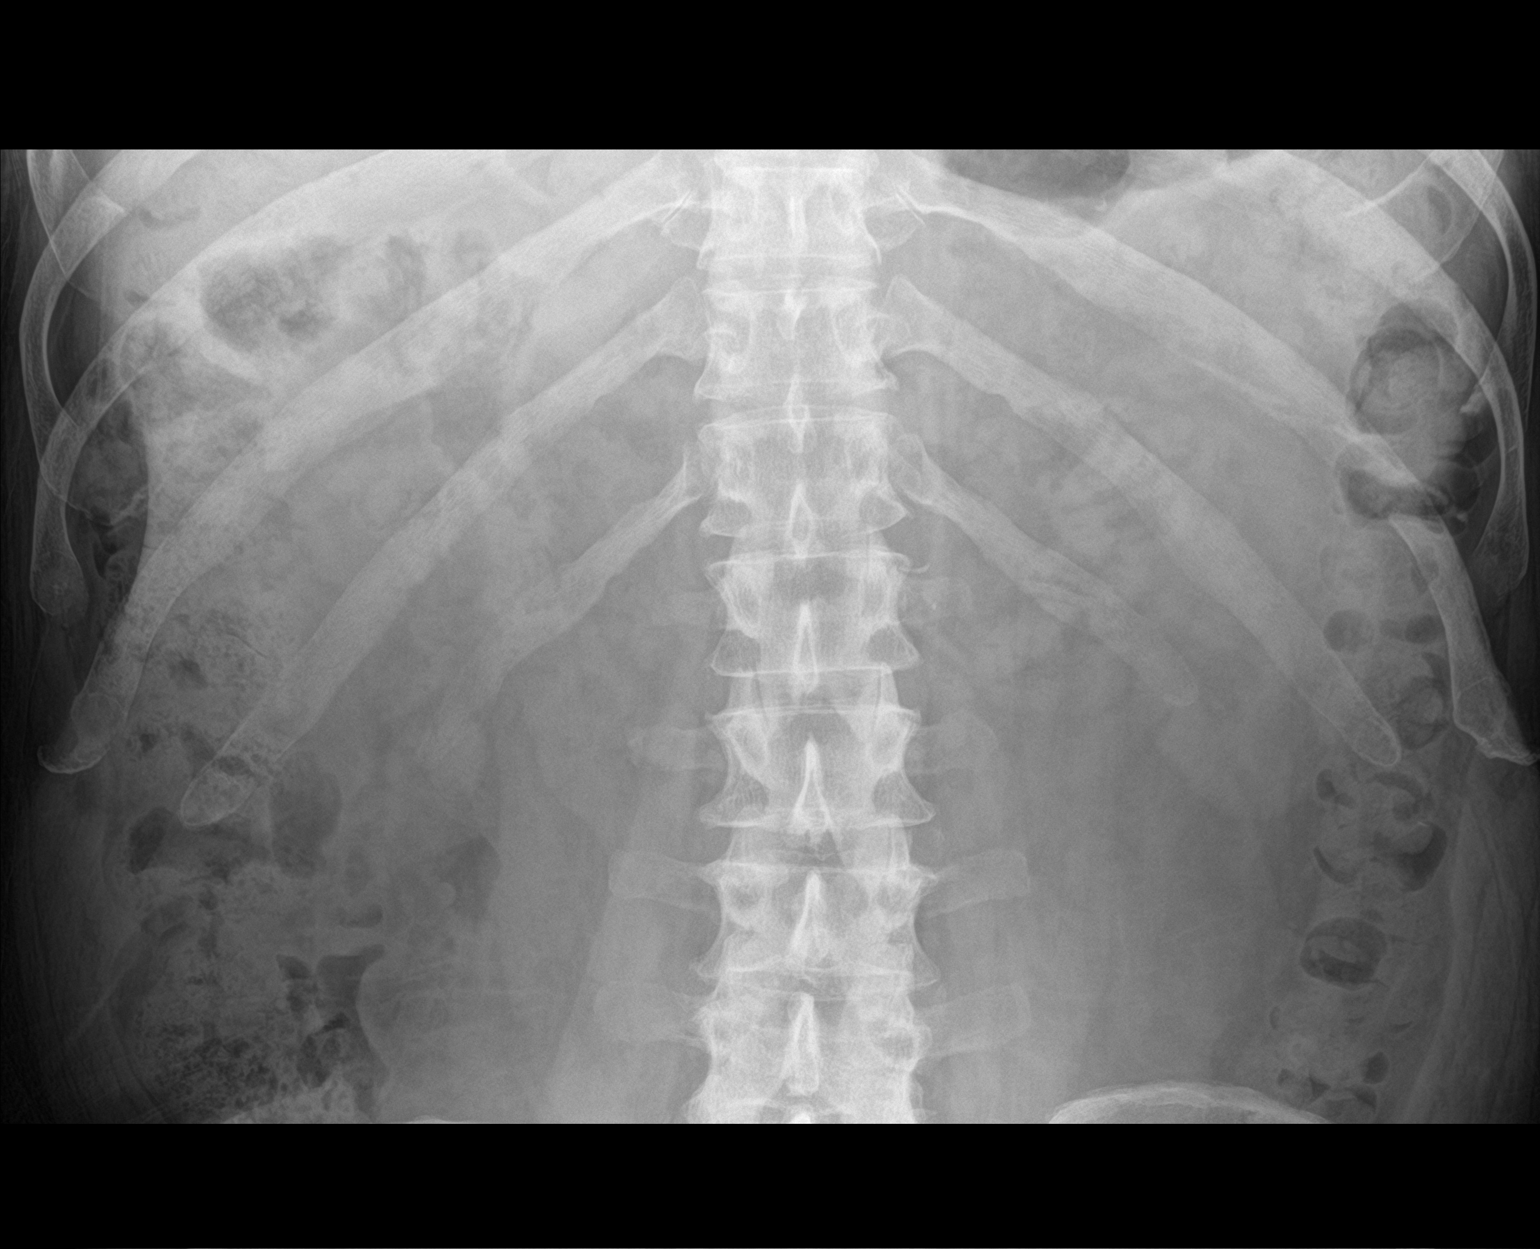
[im 2/2]
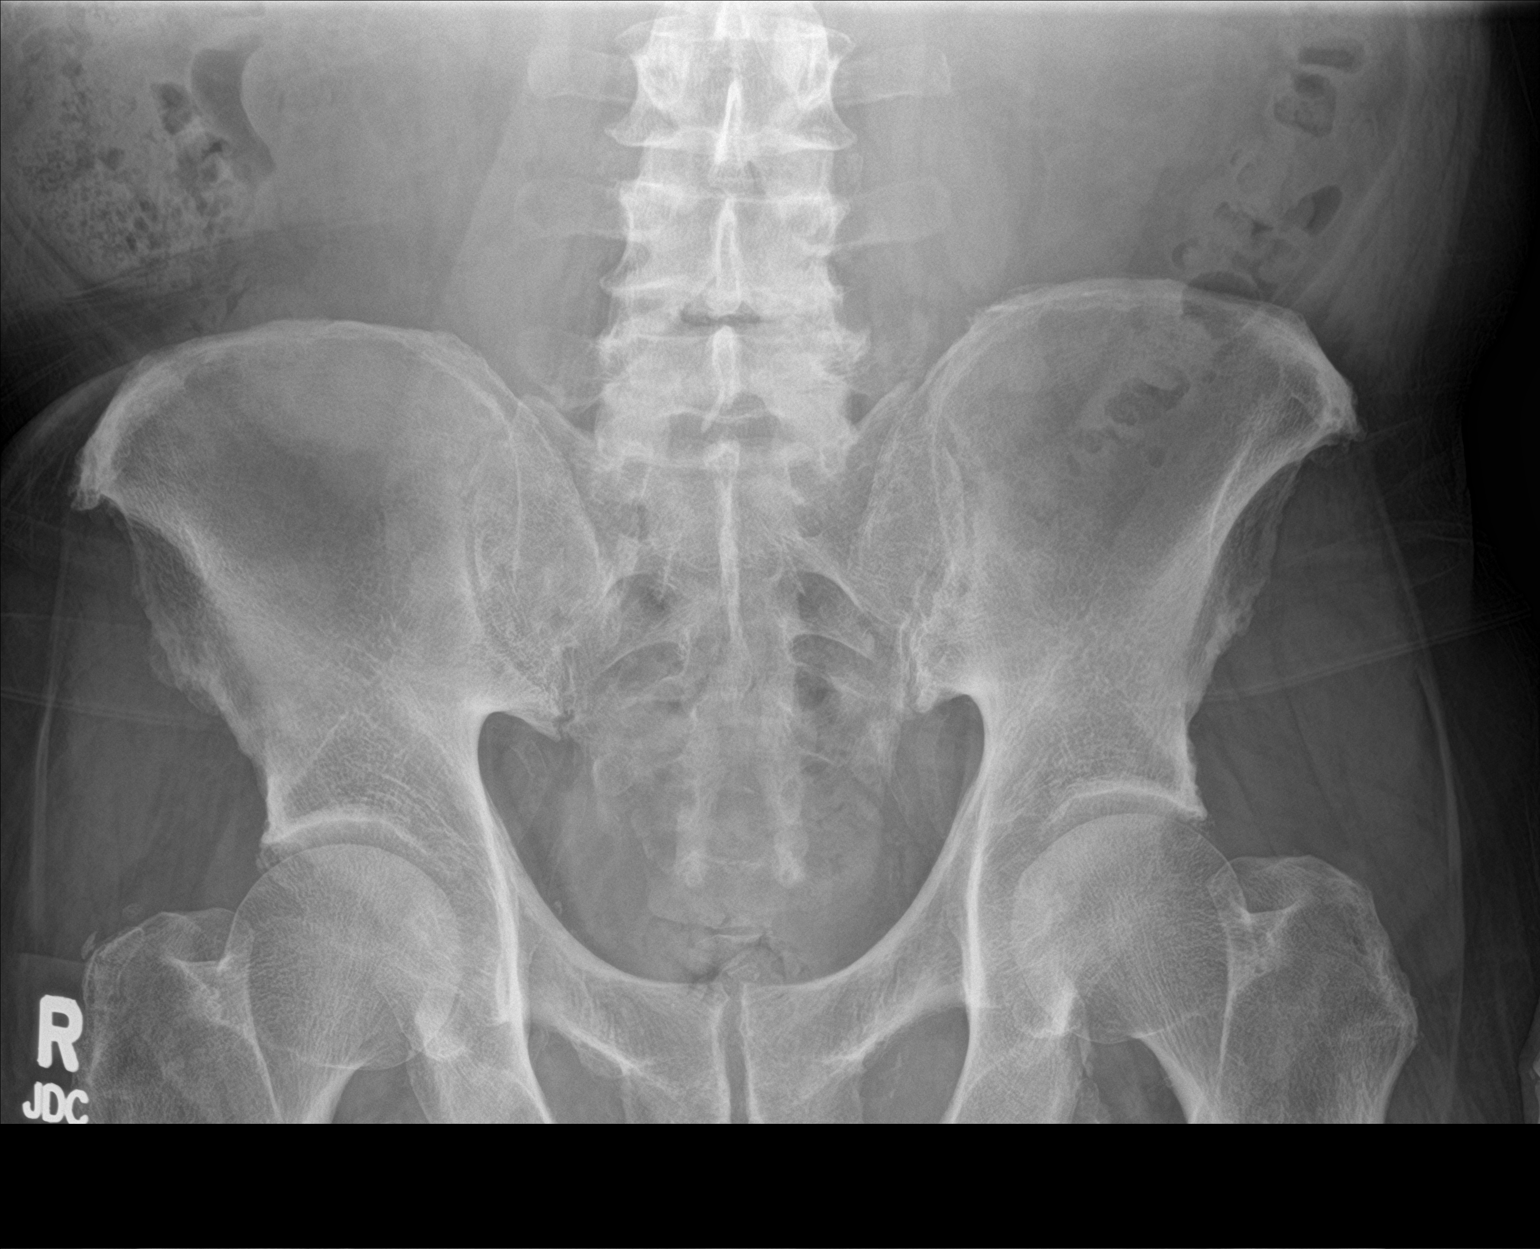

[2 of 2 positions shown; findings below may reference images not displayed]

FINDINGS: 2 supine frontal views of the abdomen and pelvis are obtained. No
radiopaque urinary tract calculi. No abdominal masses. Bowel gas
pattern is unremarkable. No acute bony abnormalities. Hemidiaphragms
are excluded by collimation.
IMPRESSION: 1. No radiopaque urinary tract calculi.

## 2021-06-21 NOTE — Progress Notes (Signed)
06/21/2021 2:30 PM   Jason Harrison 1951-03-18 [id number]  CC: Chief Complaint  Patient presents with   Follow-up   HPI: Jason Harrison is a 70 y.o. male with PMH DM2, CAD s/p cardiac stent, nephrolithiasis s/p URS in 2021, hypogonadism previously on supplemental testosterone per his PCP, ED on tadalafil, and rising PSA in the setting of instrumentation who presents today for annual follow-up.   Nephrolithiasis No new flank pain or gross hematuria in the past year. KUB today negative for radiopaque stones.  Hypogonadism Previously managed by his PCP, however he has been off supplemental testosterone (IM testosterone cypionate x2 months) due to difficulty with insurance coverage and achieving therapeutic levels. He wishes for [place] to manage this moving forward.  ED On demand dose tadalafil 20mg . He notes he sometimes takes 2 tablets at a time and still does not always achieve sufficiently firm erections.  History of rising PSA PSA dated 05/31/2021 was 1.0, previously 0.7 on 05/21/2020.  IPSS 3/pleased as below.  PVR 55 mL   IPSS     Row Name 06/21/21 1400         International Prostate Symptom Score   How often have you had the sensation of not emptying your bladder? Less than 1 in 5     How often have you had to urinate less than every two hours? Less than 1 in 5 times     How often have you found you stopped and started again several times when you urinated? Not at All     How often have you found it difficult to postpone urination? Less than 1 in 5 times     How often have you had a weak urinary stream? Not at All     How often have you had to strain to start urination? Not at All     How many times did you typically get up at night to urinate? None     Total IPSS Score 3       Quality of Life due to urinary symptoms   If you were to spend the rest of your life with your urinary condition just the way it is now how would you feel about that? Pleased               PMH: Past Medical History:  Diagnosis Date   Actinic keratosis    Diabetes mellitus without complication ([place])    type 2   Diabetic nephropathy (HCC)    Diabetic retinopathy ([place])    Myocardial infarction ([place]) 2016   ONE STENT    Surgical History: Past Surgical History:  Procedure Laterality Date   CARDIAC CATHETERIZATION N/A 01/15/2015   Procedure: Left Heart Cath and Coronary Angiography;  Surgeon: Teodoro Spray, MD;  Location: [facility];  Service: Cardiovascular;  Laterality: N/A;   CARDIAC CATHETERIZATION N/A 01/15/2015   Procedure: Coronary Stent Intervention;  Surgeon: Wellington Hampshire, MD;  Location: [facility];  Service: Cardiovascular;  Laterality: N/A;   COLONOSCOPY WITH PROPOFOL N/A 04/17/2018   Procedure: COLONOSCOPY WITH PROPOFOL;  Surgeon: Lollie Sails, MD;  Location: [facility];  Service: Endoscopy;  Laterality: N/A;   CORONARY ANGIOPLASTY     CYSTECTOMY  1972   CYSTOSCOPY WITH STENT PLACEMENT Left 07/11/2019   Procedure: CYSTOSCOPY WITH STENT PLACEMENT;  Surgeon: Hollice Espy, MD;  Location: [facility];  Service: Urology;  Laterality: Left;   CYSTOSCOPY/URETEROSCOPY/HOLMIUM LASER/STENT PLACEMENT Left 07/22/2019   Procedure: CYSTOSCOPY/URETEROSCOPY/HOLMIUM LASER/STENT Exchange;  Surgeon: Vanna Scotland, MD;  Location: [facility];  Service: Urology;  Laterality: Left;   EYE SURGERY      Home Medications:  Allergies as of 06/21/2021       Reactions   Trulicity [dulaglutide] Itching, Swelling        Medication List        Accurate as of June 21, 2021  3:45 PM. If you have any questions, ask your nurse or doctor.          STOP taking these medications    oxybutynin 5 MG tablet Commonly known as: DITROPAN Stopped by: Carman Ching, PA-C   tamsulosin 0.4 MG Caps capsule Commonly known as: Flomax Stopped by: Carman Ching, PA-C   testosterone cypionate 100 MG/ML injection Commonly known as:  DEPOTESTOTERONE CYPIONATE Stopped by: Carman Ching, PA-C       TAKE these medications    Alpha-Lipoic Acid 600 MG Caps Take 600 mg by mouth daily.   aspirin EC 81 MG tablet Take 81 mg by mouth every evening.   atorvastatin 20 MG tablet Commonly known as: LIPITOR Take 20 mg by mouth every evening.   B-12 5000 MCG Caps Take 5,000 mcg by mouth daily.   BD Pen Needle Nano 2nd Gen 32G X 4 MM Misc Generic drug: Insulin Pen Needle USE 1 PEN NEEDLE ONCE DAILY   Biotin 09735 MCG Tabs Take 10,000 mcg by mouth daily.   brimonidine 0.2 % ophthalmic solution Commonly known as: ALPHAGAN Place 1 drop into the right eye 2 (two) times daily.   cetirizine 10 MG tablet Commonly known as: ZYRTEC Take 10 mg by mouth every morning.   CHROMIUM PICOLATE PO Take 5,000 mcg by mouth daily.   dorzolamide-timolol 22.3-6.8 MG/ML ophthalmic solution Commonly known as: COSOPT Place 1 drop into the right eye 2 (two) times daily.   Fish Oil 1000 MG Caps Take 1,000 mg by mouth daily.   fluorometholone 0.1 % ophthalmic suspension Commonly known as: FML 1 drop daily.   glimepiride 2 MG tablet Commonly known as: AMARYL TAKE 1 TABLET(2 MG) BY MOUTH DAILY WITH BREAKFAST   HYDROcodone-acetaminophen 5-325 MG tablet Commonly known as: NORCO/VICODIN Take 1-2 tablets by mouth every 6 (six) hours as needed for moderate pain or severe pain.   ibuprofen 200 MG tablet Commonly known as: ADVIL Take 200-600 mg by mouth every 6 (six) hours as needed for moderate pain.   Invokana 300 MG Tabs tablet Generic drug: canagliflozin Take 150 mg by mouth daily.   latanoprost 0.005 % ophthalmic solution Commonly known as: XALATAN Place 1 drop into the right eye at bedtime.   LUBRICATING EYE DROPS OP Place 1 drop into both eyes 2 (two) times daily.   melatonin 5 MG Tabs Take 2.5 mg by mouth at bedtime as needed (sleep).   metFORMIN 1000 MG tablet Commonly known as: GLUCOPHAGE Take 1,000 mg by  mouth 2 (two) times daily.   multivitamin with minerals Tabs tablet Take 1 tablet by mouth daily.   ondansetron 4 MG disintegrating tablet Commonly known as: Zofran ODT Take 1 tablet (4 mg total) by mouth every 6 (six) hours as needed for nausea or vomiting.   OVER THE COUNTER MEDICATION Take 1 capsule by mouth daily. Saffron supplement   pioglitazone 15 MG tablet Commonly known as: ACTOS Take 15 mg by mouth daily.   PreserVision AREDS 2 Caps Take 1 capsule by mouth 2 (two) times daily.   Probiotic Caps Take 1 capsule by mouth daily.  tadalafil 20 MG tablet Commonly known as: CIALIS Take 20 mg by mouth daily as needed.   ticagrelor 90 MG Tabs tablet Commonly known as: BRILINTA Take 1 tablet (90 mg total) by mouth 2 (two) times daily.   TURMERIC PO Take 320 mg by mouth daily.   valsartan 160 MG tablet Commonly known as: DIOVAN Take 1 tablet (160 mg total) by mouth daily.   Victoza 18 MG/3ML Sopn Generic drug: liraglutide Inject 1.8 mg into the skin at bedtime.   vitamin C 1000 MG tablet Take 1,000 mg by mouth daily.   Vitamin D 50 MCG (2000 UT) tablet Take 2,000 Units by mouth daily.   VITAMIN K PO Take 1 capsule by mouth daily.        Allergies:  Allergies  Allergen Reactions   Trulicity [Dulaglutide] Itching and Swelling    Family History: No family history on file.  Social History:   reports that he has never smoked. He has never used smokeless tobacco. He reports that he does not drink alcohol and does not use drugs.  Physical Exam: BP 131/82    Pulse 92    Ht 5\' 10"  (1.778 m)    Wt 202 lb (91.6 kg)    BMI 28.98 kg/m   Constitutional:  Alert and oriented, no acute distress, nontoxic appearing HEENT: [place], AT Cardiovascular: No clubbing, cyanosis, or edema Respiratory: Normal respiratory effort, no increased work of breathing GU: Normal sphincter tone. Smooth, symmetrically enlarged 50+cc prostate without nodules or induration. Skin: No  rashes, bruises or suspicious lesions Neurologic: Grossly intact, no focal deficits, moving all 4 extremities Psychiatric: Normal mood and affect  Laboratory Data: Results for orders placed or performed in visit on 05/31/21  PSA  Result Value Ref Range   Prostate Specific Ag, Serum 1.0 0.0 - 4.0 ng/mL   Pertinent Imaging: Results for orders placed during the hospital encounter of 06/21/21  DG Abd 1 View  Narrative CLINICAL DATA:  Nephrolithiasis  EXAM: ABDOMEN - 1 VIEW  COMPARISON:  02/17/2020  FINDINGS: 2 supine frontal views of the abdomen and pelvis are obtained. No radiopaque urinary tract calculi. No abdominal masses. Bowel gas pattern is unremarkable. No acute bony abnormalities. Hemidiaphragms are excluded by collimation.  IMPRESSION: 1. No radiopaque urinary tract calculi.   Electronically Signed By: Randa Ngo M.D. On: 06/21/2021 10:03  I personally reviewed the images referenced above and note no radiopaque calculi.  Assessment & Plan:   1. Benign prostatic hyperplasia without lower urinary tract symptoms IPSS with minimal symptoms off pharmacotherapy. Will repeat annually and consider alpha blockers in the future if symptoms worsen. PVR WNL. PSA is stable and DRE benign. Will repeat annually. - Bladder Scan (Post Void Residual) in office  2. History of nephrolithiasis No evidence of interval stone development in the past year; patient remains asymptomatic. Will discontinue annual KUBs for now.  3. Hypogonadism in male Patient wishes for [place] to manage this moving forward. Will obtain AM testosterone to establish baseline in the absence of supplemental testosterone and proceed as indicated. - Testosterone; Future  4. Erectile dysfunction, unspecified erectile dysfunction type Will resume supplemental testosterone as above and reassess; ok to continue tadalafil 20mg  PRN for now.   Return in about 2 weeks (around 07/05/2021) for Hypogonadism f/u with  SHIM and AM testosterone prior.  Debroah Loop, PA-C  [facility] [address], [place], [place] [address] [phone]

## 2021-07-02 ENCOUNTER — Other Ambulatory Visit: Payer: Medicare Other

## 2021-07-02 ENCOUNTER — Other Ambulatory Visit: Payer: Self-pay

## 2021-07-02 DIAGNOSIS — E291 Testicular hypofunction: Secondary | ICD-10-CM

## 2021-07-03 LAB — TESTOSTERONE: Testosterone: 331 ng/dL (ref 264–916)

## 2021-07-07 ENCOUNTER — Telehealth: Payer: Self-pay

## 2021-07-07 ENCOUNTER — Other Ambulatory Visit: Payer: Self-pay

## 2021-07-07 DIAGNOSIS — E291 Testicular hypofunction: Secondary | ICD-10-CM

## 2021-07-07 NOTE — Telephone Encounter (Signed)
Notified pt as advised. Repeat AM testosterone appt made for 1/10. OV pushed out to 1/12. Patient confirmed all appt changes.

## 2021-07-07 NOTE — Telephone Encounter (Signed)
-----   Message from Carman Ching, [place] sent at 07/06/2021  5:51 PM EST ----- AM testosterone was low-normal. I'd like to repeat it before seeing him back in clinic. OK to push out his next appointment with me to allow a second AM testosterone prior. ----- Message ----- From: Nell [facility] Lab Results In Sent: 07/03/2021   5:37 AM EST To: Carman Ching, PA-C

## 2021-07-09 ENCOUNTER — Ambulatory Visit: Payer: Medicare Other | Admitting: Physician Assistant

## 2021-07-13 ENCOUNTER — Other Ambulatory Visit: Payer: Self-pay

## 2021-07-13 ENCOUNTER — Other Ambulatory Visit: Payer: Medicare Other

## 2021-07-13 DIAGNOSIS — E291 Testicular hypofunction: Secondary | ICD-10-CM

## 2021-07-14 LAB — TESTOSTERONE: Testosterone: 281 ng/dL (ref 264–916)

## 2021-07-15 ENCOUNTER — Ambulatory Visit: Payer: Medicare Other | Admitting: Physician Assistant

## 2021-07-20 NOTE — Progress Notes (Signed)
07/21/2021 3:48 PM   Jason Harrison 11/12/1950 [id number]  Referring provider: Tracie Harrier, MD [address] [facility] [place],  [place] [address]  Chief Complaint  Patient presents with   Hypogonadism   Benign Prostatic Hypertrophy   Urological history 1. Nephrolithiasis -left URS 2021 -KUB 06/2021 - negative for stone  2. Testosterone deficiency -contributing factors of age, diabetes and obesity -testosterone levels 281 and 331 07/2021  3. ED -contributing factors of age, diabetes, obesity and CAD -SHIM 5 -managed with tadalafil 20 mg, on-demand-dosing  4. BPH with LU TS -PSA 1.0 05/2021 -I PSS 8/5  HPI: Jason Harrison is a 71 y.o. male who presents today for further management of testosterone deficiency.  He was on testosterone cypionate 100 mg IM every 2 weeks, but he found he could not get to therapeutic levels with this dosage.  He states that he suffers from low motivation, fatigue and elevated sugars off the medication.  He has not urinary complaints at this time.  When I asked him about the 5 value on the questionnaire, he states he did not read it correctly.  Patient denies any modifying or aggravating factors.  Patient denies any gross hematuria, dysuria or suprapubic/flank pain.  Patient denies any fevers, chills, nausea or vomiting.     IPSS     Row Name 07/21/21 1500         International Prostate Symptom Score   How often have you had the sensation of not emptying your bladder? Less than 1 in 5     How often have you had to urinate less than every two hours? Less than 1 in 5 times     How often have you found you stopped and started again several times when you urinated? Less than 1 in 5 times     How often have you found it difficult to postpone urination? Less than half the time     How often have you had a weak urinary stream? Less than 1 in 5 times     How often have you had to strain to start urination? Less than 1 in 5  times     How many times did you typically get up at night to urinate? 1 Time     Total IPSS Score 8       Quality of Life due to urinary symptoms   If you were to spend the rest of your life with your urinary condition just the way it is now how would you feel about that? Unhappy              Score:  1-7 Mild 8-19 Moderate 20-35 Severe    He had been on tadalafil 10 mg, on-demand dosing with equivocal results.  Patient is not having spontaneous erections.  He denies any pain or curvature with erections.     SHIM     Row Name 07/21/21 1510         SHIM: Over the last 6 months:   How do you rate your confidence that you could get and keep an erection? Very Low     When you had erections with sexual stimulation, how often were your erections hard enough for penetration (entering your partner)? Almost Never or Never     During sexual intercourse, how often were you able to maintain your erection after you had penetrated (entered) your partner? Almost Never or Never     During sexual intercourse, how difficult was  it to maintain your erection to completion of intercourse? Extremely Difficult     When you attempted sexual intercourse, how often was it satisfactory for you? Almost Never or Never       SHIM Total Score   SHIM 5              Score: 1-7 Severe ED 8-11 Moderate ED 12-16 Mild-Moderate ED 17-21 Mild ED 22-25 No ED     PMH: Past Medical History:  Diagnosis Date   Actinic keratosis    Diabetes mellitus without complication ([place])    type 2   Diabetic nephropathy ([facility])    Diabetic retinopathy ([place])    Myocardial infarction ([place]) 2016   ONE STENT    Surgical History: Past Surgical History:  Procedure Laterality Date   CARDIAC CATHETERIZATION N/A 01/15/2015   Procedure: Left Heart Cath and Coronary Angiography;  Surgeon: Teodoro Spray, MD;  Location: [facility] [facility];  Service: Cardiovascular;  Laterality: N/A;   CARDIAC CATHETERIZATION N/A  01/15/2015   Procedure: Coronary Stent Intervention;  Surgeon: Wellington Hampshire, MD;  Location: [facility];  Service: Cardiovascular;  Laterality: N/A;   COLONOSCOPY WITH PROPOFOL N/A 04/17/2018   Procedure: COLONOSCOPY WITH PROPOFOL;  Surgeon: Lollie Sails, MD;  Location: [facility] ENDOSCOPY;  Service: Endoscopy;  Laterality: N/A;   CORONARY ANGIOPLASTY     CYSTECTOMY  1972   CYSTOSCOPY WITH STENT PLACEMENT Left 07/11/2019   Procedure: CYSTOSCOPY WITH STENT PLACEMENT;  Surgeon: Hollice Espy, MD;  Location: [facility] ORS;  Service: Urology;  Laterality: Left;   CYSTOSCOPY/URETEROSCOPY/HOLMIUM LASER/STENT PLACEMENT Left 07/22/2019   Procedure: CYSTOSCOPY/URETEROSCOPY/HOLMIUM LASER/STENT Exchange;  Surgeon: Hollice Espy, MD;  Location: [facility] ORS;  Service: Urology;  Laterality: Left;   EYE SURGERY      Home Medications:  Allergies as of 07/21/2021       Reactions   Trulicity [dulaglutide] Itching, Swelling        Medication List        Accurate as of July 21, 2021  3:48 PM. If you have any questions, ask your nurse or doctor.          Alpha-Lipoic Acid 600 MG Caps Take 600 mg by mouth daily.   aspirin EC 81 MG tablet Take 81 mg by mouth every evening.   atorvastatin 20 MG tablet Commonly known as: LIPITOR Take 20 mg by mouth every evening.   B-12 5000 MCG Caps Take 5,000 mcg by mouth daily.   BD Pen Needle Nano 2nd Gen 32G X 4 MM Misc Generic drug: Insulin Pen Needle USE 1 PEN NEEDLE ONCE DAILY   Biotin 10000 MCG Tabs Take 10,000 mcg by mouth daily.   brimonidine 0.2 % ophthalmic solution Commonly known as: ALPHAGAN Place 1 drop into the right eye 2 (two) times daily.   cetirizine 10 MG tablet Commonly known as: ZYRTEC Take 10 mg by mouth every morning.   CHROMIUM PICOLATE PO Take 5,000 mcg by mouth daily.   dorzolamide-timolol 22.3-6.8 MG/ML ophthalmic solution Commonly known as: COSOPT Place 1 drop into the right eye 2 (two) times daily.    Fish Oil 1000 MG Caps Take 1,000 mg by mouth daily.   fluorometholone 0.1 % ophthalmic suspension Commonly known as: FML 1 drop daily.   glimepiride 2 MG tablet Commonly known as: AMARYL TAKE 1 TABLET(2 MG) BY MOUTH DAILY WITH BREAKFAST   HYDROcodone-acetaminophen 5-325 MG tablet Commonly known as: NORCO/VICODIN Take 1-2 tablets by mouth every 6 (six) hours as needed  for moderate pain or severe pain.   ibuprofen 200 MG tablet Commonly known as: ADVIL Take 200-600 mg by mouth every 6 (six) hours as needed for moderate pain.   Invokana 300 MG Tabs tablet Generic drug: canagliflozin Take 150 mg by mouth daily.   latanoprost 0.005 % ophthalmic solution Commonly known as: XALATAN Place 1 drop into the right eye at bedtime.   LUBRICATING EYE DROPS OP Place 1 drop into both eyes 2 (two) times daily.   melatonin 5 MG Tabs Take 2.5 mg by mouth at bedtime as needed (sleep).   metFORMIN 1000 MG tablet Commonly known as: GLUCOPHAGE Take 1,000 mg by mouth 2 (two) times daily.   multivitamin with minerals Tabs tablet Take 1 tablet by mouth daily.   ondansetron 4 MG disintegrating tablet Commonly known as: Zofran ODT Take 1 tablet (4 mg total) by mouth every 6 (six) hours as needed for nausea or vomiting.   OVER THE COUNTER MEDICATION Take 1 capsule by mouth daily. Saffron supplement   pioglitazone 15 MG tablet Commonly known as: ACTOS Take 15 mg by mouth daily.   PreserVision AREDS 2 Caps Take 1 capsule by mouth 2 (two) times daily.   Probiotic Caps Take 1 capsule by mouth daily.   tadalafil 20 MG tablet Commonly known as: CIALIS Take 20 mg by mouth daily as needed.   testosterone cypionate 200 MG/ML injection Commonly known as: DEPOTESTOSTERONE CYPIONATE Inject 1 mL (200 mg total) into the muscle every 14 (fourteen) days. Started by: Zara Council, PA-C   ticagrelor 90 MG Tabs tablet Commonly known as: BRILINTA Take 1 tablet (90 mg total) by mouth 2 (two)  times daily.   TURMERIC PO Take 320 mg by mouth daily.   valsartan 160 MG tablet Commonly known as: DIOVAN Take 1 tablet (160 mg total) by mouth daily.   Victoza 18 MG/3ML Sopn Generic drug: liraglutide Inject 1.8 mg into the skin at bedtime.   vitamin C 1000 MG tablet Take 1,000 mg by mouth daily.   Vitamin D 50 MCG (2000 UT) tablet Take 2,000 Units by mouth daily.   VITAMIN K PO Take 1 capsule by mouth daily.        Allergies:  Allergies  Allergen Reactions   Trulicity [Dulaglutide] Itching and Swelling    Family History: No family history on file.  Social History:  reports that he has never smoked. He has never used smokeless tobacco. He reports that he does not drink alcohol and does not use drugs.  ROS: Pertinent ROS in HPI  Physical Exam: BP (!) 148/90    Pulse 92    Ht 5' 10"  (1.778 m)    Wt 204 lb (92.5 kg)    BMI 29.27 kg/m   Constitutional:  Well nourished. Alert and oriented, No acute distress. HEENT:  AT, mask in place.  Trachea midline Cardiovascular: No clubbing, cyanosis, or edema. Respiratory: Normal respiratory effort, no increased work of breathing. Neurologic: Grossly intact, no focal deficits, moving all 4 extremities. Psychiatric: Normal mood and affect.  Laboratory Data: Lab Results  Component Value Date   TESTOSTERONE 281 07/13/2021   WBC (White Blood Cell Count) 4.1 - 10.2 103/uL 6.8   RBC (Red Blood Cell Count) 4.69 - 6.13 106/uL 5.18   Hemoglobin 14.1 - 18.1 gm/dL 15.1   Hematocrit 40.0 - 52.0 % 46.6   MCV (Mean Corpuscular Volume) 80.0 - 100.0 fl 90.0   MCH (Mean Corpuscular Hemoglobin) 27.0 - 31.2 pg 29.2   MCHC (  Mean Corpuscular Hemoglobin Concentration) 32.0 - 36.0 gm/dL 32.4   Platelet Count 150 - 450 103/uL 241   RDW-CV (Red Cell Distribution Width) 11.6 - 14.8 % 13.0   MPV (Mean Platelet Volume) 9.4 - 12.4 fl 9.3 Low    Neutrophils 1.50 - 7.80 103/uL 3.29   Lymphocytes 1.00 - 3.60 103/uL 2.24   Monocytes 0.00 -  1.50 103/uL 0.64   Eosinophils 0.00 - 0.55 103/uL 0.57 High    Basophils 0.00 - 0.09 103/uL 0.03   Neutrophil % 32.0 - 70.0 % 48.7   Lymphocyte % 10.0 - 50.0 % 33.0   Monocyte % 4.0 - 13.0 % 9.4   Eosinophil % 1.0 - 5.0 % 8.4 High    Basophil% 0.0 - 2.0 % 0.4   Immature Granulocyte % <=0.7 % 0.1   Immature Granulocyte Count <=0.06 10^3/L 0.01   Resulting Agency  [facility] - [facility]  Specimen Collected: 06/10/21 07:48 Last Resulted: 06/10/21 08:54  Received From: [place]  Result Received: 06/21/21 08:27   Thyroid Stimulating Hormone (TSH) 0.450-5.330 uIU/ml uIU/mL 2.696   Resulting Agency  [facility] - [facility]  Specimen Collected: 06/10/21 07:48 Last Resulted: 06/10/21 10:24  Received From: [facility]  Result Received: 06/21/21 08:27   Hemoglobin A1C 4.2 - 5.6 % 7.8 High    Average Blood Glucose (Calc) mg/dL 177   Resulting Agency  [facility] - LAB  Narrative Performed by [facility] - [facility] Normal Range:    4.2 - 5.6%  Increased Risk:  5.7 - 6.4%  Diabetes:        >= 6.5%  Glycemic Control for adults with diabetes:  <7%   Specimen Collected: 06/10/21 07:48 Last Resulted: 06/10/21 09:47  Received From: [facility]  Result Received: 06/21/21 08:27   Color Yellow, Violet, Light Violet, Dark Violet Yellow   Clarity Clear, Other Clear   Specific Gravity 1.000 - 1.030 1.020   pH, Urine 5.0 - 8.0 5.5   Protein, Urinalysis Negative, Trace mg/dL Negative   Glucose, Urinalysis Negative mg/dL >=1000 Abnormal    Ketones, Urinalysis Negative mg/dL Negative   Blood, Urinalysis Negative Negative   Nitrite, Urinalysis Negative Negative   Leukocyte Esterase, Urinalysis Negative Negative   White Blood Cells, Urinalysis None Seen, 0-3 /hpf None Seen   Red Blood Cells, Urinalysis None Seen, 0-3 /hpf None Seen   Bacteria, Urinalysis None Seen /hpf None Seen   Squamous Epithelial Cells, Urinalysis Rare,  Few, None Seen /hpf None Seen   Resulting Agency  [facility] - [facility]  Specimen Collected: 06/10/21 07:48 Last Resulted: 06/10/21 09:18  Received From: [facility]  Result Received: 06/21/21 08:27   Cholesterol, Total 100 - 200 mg/dL 128   Triglyceride 35 - 199 mg/dL 155   HDL (High Density Lipoprotein) Cholesterol 29.0 - 71.0 mg/dL 45.2   LDL Calculated 0 - 130 mg/dL 52   VLDL Cholesterol mg/dL 31   Cholesterol/HDL Ratio  2.8   Resulting Agency  [facility] - LAB  Specimen Collected: 06/10/21 07:48 Last Resulted: 06/10/21 09:43  Received From: [facility]  Result Received: 06/21/21 08:27   Glucose 70 - 110 mg/dL 151 High    Sodium 136 - 145 mmol/L 138   Potassium 3.6 - 5.1 mmol/L 4.6   Chloride 97 - 109 mmol/L 105   Carbon Dioxide (CO2) 22.0 - 32.0 mmol/L 26.5   Urea Nitrogen (BUN) 7 -  25 mg/dL 25   Creatinine 0.7 - 1.3 mg/dL 1.0   Glomerular Filtration Rate (eGFR), MDRD Estimate >60 mL/min/1.73sq m 74   Calcium 8.7 - 10.3 mg/dL 9.3   AST  8 - 39 U/L 20   ALT  6 - 57 U/L 43   Alk Phos (alkaline Phosphatase) 34 - 104 U/L 57   Albumin 3.5 - 4.8 g/dL 4.4   Bilirubin, Total 0.3 - 1.2 mg/dL 0.6   Protein, Total 6.1 - 7.9 g/dL 6.9   A/G Ratio 1.0 - 5.0 gm/dL 1.8   Resulting Agency  [facility] - LAB  Specimen Collected: 06/10/21 07:48 Last Resulted: 06/10/21 09:43  Received From: [place]  Result Received: 06/21/21 08:27   Component     Latest Ref Rng & Units 05/31/2021  Prostate Specific Ag, Serum     0.0 - 4.0 ng/mL 1.0  I have reviewed the labs.   Pertinent Imaging: N/A  Assessment & Plan:    1. Testosterone deficiency -Significant symptoms -Recommend starting TRT -We discussed the most common forms of replacement including intramuscular injection and gels and he desires to start injections -Rx testosterone cypionate-200 mg every 2 weeks to start -Follow-up 6 weeks after starting TRT  for testosterone level and symptom check -Potential side effects of testosterone replacement were discussed including stimulation of benign prostatic growth with lower urinary tract symptoms; erythrocytosis; edema; gynecomastia; worsening sleep apnea; venous thromboembolism; testicular atrophy and infertility. Recent studies suggesting an increased incidence of heart attack and stroke in patients taking testosterone was discussed. He was informed there is conflicting evidence regarding the impact of testosterone therapy on cardiovascular risk. The theoretical risk of growth stimulation of an undetected prostate cancer was also discussed.  He was informed that current evidence does not provide any definitive answers regarding the risks of testosterone therapy on prostate cancer and cardiovascular disease. The need for periodic monitoring of his testosterone level, PSA, hematocrit and DRE was discussed.   2. BPH with LUTS -PSA stable -DRE benign -continue conservative management, avoiding bladder irritants and timed voiding's   3. ED -We will have a trial of sildenafil 100 mg on demand dosing-coupon given for good Rx   Return in about 5 weeks (around 08/25/2021) for Testosterone only.  These notes generated with voice recognition software. I apologize for typographical errors.  Zara Council, PA-C  [facility] [address]  [place], [place] [address] [phone]

## 2021-07-21 ENCOUNTER — Encounter: Payer: Self-pay | Admitting: Urology

## 2021-07-21 ENCOUNTER — Ambulatory Visit (INDEPENDENT_AMBULATORY_CARE_PROVIDER_SITE_OTHER): Payer: Medicare Other | Admitting: Urology

## 2021-07-21 ENCOUNTER — Other Ambulatory Visit: Payer: Self-pay

## 2021-07-21 VITALS — BP 148/90 | HR 92 | Ht 70.0 in | Wt 204.0 lb

## 2021-07-21 DIAGNOSIS — N400 Enlarged prostate without lower urinary tract symptoms: Secondary | ICD-10-CM

## 2021-07-21 DIAGNOSIS — E349 Endocrine disorder, unspecified: Secondary | ICD-10-CM

## 2021-07-21 DIAGNOSIS — N529 Male erectile dysfunction, unspecified: Secondary | ICD-10-CM

## 2021-07-21 MED ORDER — TESTOSTERONE CYPIONATE 200 MG/ML IM SOLN: 200.0000 mg | INTRAMUSCULAR | 0 refills | Status: DC

## 2021-07-22 LAB — PROLACTIN: Prolactin: 15 ng/mL (ref 4.0–15.2)

## 2021-07-22 LAB — LUTEINIZING HORMONE: LH: 15.5 m[IU]/mL — ABNORMAL HIGH (ref 1.7–8.6)

## 2021-07-23 ENCOUNTER — Telehealth: Payer: Self-pay

## 2021-07-23 MED ORDER — SILDENAFIL CITRATE 100 MG PO TABS: 100.0000 mg | ORAL_TABLET | Freq: Every day | ORAL | 0 refills | Status: DC | PRN

## 2021-07-23 NOTE — Telephone Encounter (Signed)
Ok per shannon to send in medication.

## 2021-08-23 ENCOUNTER — Ambulatory Visit: Payer: Medicare Other | Admitting: Dermatology

## 2021-09-09 ENCOUNTER — Other Ambulatory Visit: Payer: Self-pay

## 2021-09-09 DIAGNOSIS — E349 Endocrine disorder, unspecified: Secondary | ICD-10-CM

## 2021-09-15 ENCOUNTER — Ambulatory Visit: Payer: Medicare Other | Admitting: Urology

## 2021-09-16 ENCOUNTER — Other Ambulatory Visit: Payer: Medicare Other

## 2021-09-16 ENCOUNTER — Other Ambulatory Visit: Payer: Self-pay

## 2021-09-17 ENCOUNTER — Other Ambulatory Visit: Payer: Self-pay | Admitting: *Deleted

## 2021-09-17 DIAGNOSIS — E349 Endocrine disorder, unspecified: Secondary | ICD-10-CM

## 2021-09-17 DIAGNOSIS — R972 Elevated prostate specific antigen [PSA]: Secondary | ICD-10-CM

## 2021-09-17 DIAGNOSIS — E291 Testicular hypofunction: Secondary | ICD-10-CM

## 2021-09-17 LAB — TESTOSTERONE: Testosterone: 524 ng/dL (ref 264–916)

## 2021-09-20 ENCOUNTER — Ambulatory Visit: Payer: Medicare Other | Admitting: Dermatology

## 2021-10-27 ENCOUNTER — Ambulatory Visit (INDEPENDENT_AMBULATORY_CARE_PROVIDER_SITE_OTHER): Payer: Medicare Other | Admitting: Dermatology

## 2021-10-27 DIAGNOSIS — L821 Other seborrheic keratosis: Secondary | ICD-10-CM | POA: Diagnosis not present

## 2021-10-27 DIAGNOSIS — D692 Other nonthrombocytopenic purpura: Secondary | ICD-10-CM | POA: Diagnosis not present

## 2021-10-27 DIAGNOSIS — L570 Actinic keratosis: Secondary | ICD-10-CM

## 2021-10-27 NOTE — Progress Notes (Signed)
? ?  Follow-Up Visit ?  ?Subjective  ?Cali Cuartas is a 71 y.o. male who presents for the following: Actinic Keratosis (Bil ears, 74m f/u). ?The patient has spots, moles and lesions to be evaluated, some may be new or changing and the patient has concerns that these could be cancer. ? ?The following portions of the chart were reviewed this encounter and updated as appropriate:  ? Tobacco  Allergies  Meds  Problems  Med Hx  Surg Hx  Fam Hx   ?  ?Review of Systems:  No other skin or systemic complaints except as noted in HPI or Assessment and Plan. ? ?Objective  ?Well appearing patient in no apparent distress; mood and affect are within normal limits. ? ?A focused examination was performed including face, ears, arms. Relevant physical exam findings are noted in the Assessment and Plan. ? ?L ear x 3, R ear x 2 (5) ?Pink scaly macules ? ? ?Assessment & Plan  ? ?Seborrheic Keratoses ?- Stuck-on, waxy, tan-brown papules and/or plaques  ?- Benign-appearing ?- Discussed benign etiology and prognosis. ?- Observe ?- Call for any changes ? ?Purpura - Chronic; persistent and recurrent.  Treatable, but not curable. ?- Violaceous macules and patches ?- Benign ?- Related to trauma, age, sun damage and/or use of blood thinners, chronic use of topical and/or oral steroids ?- Observe ?- Can use OTC arnica containing moisturizer such as Dermend Bruise Formula if desired ?- Call for worsening or other concerns  ? ?AK (actinic keratosis) (5) ?L ear x 3, R ear x 2 ? ?Destruction of lesion - L ear x 3, R ear x 2 ?Complexity: simple   ?Destruction method: cryotherapy   ?Informed consent: discussed and consent obtained   ?Timeout:  patient name, date of birth, surgical site, and procedure verified ?Lesion destroyed using liquid nitrogen: Yes   ?Region frozen until ice ball extended beyond lesion: Yes   ?Outcome: patient tolerated procedure well with no complications   ?Post-procedure details: wound care instructions given    ? ? ?Return for 6-12 months AK f/u, UBSE. ? ?I, Ardis Rowan, RMA, am acting as scribe for Armida Sans, MD . ?Documentation: I have reviewed the above documentation for accuracy and completeness, and I agree with the above. ? ?Armida Sans, MD ? ? ? ?

## 2021-10-27 NOTE — Patient Instructions (Addendum)

## 2021-11-07 ENCOUNTER — Encounter: Payer: Self-pay | Admitting: Dermatology

## 2021-12-09 ENCOUNTER — Other Ambulatory Visit: Payer: Medicare Other

## 2021-12-09 ENCOUNTER — Other Ambulatory Visit: Payer: Self-pay

## 2021-12-09 DIAGNOSIS — R972 Elevated prostate specific antigen [PSA]: Secondary | ICD-10-CM

## 2021-12-09 DIAGNOSIS — E349 Endocrine disorder, unspecified: Secondary | ICD-10-CM

## 2021-12-09 DIAGNOSIS — E291 Testicular hypofunction: Secondary | ICD-10-CM

## 2021-12-10 LAB — HEMOGLOBIN AND HEMATOCRIT, BLOOD
Hematocrit: 47.8 % (ref 37.5–51.0)
Hemoglobin: 15.9 g/dL (ref 13.0–17.7)

## 2021-12-10 LAB — TESTOSTERONE: Testosterone: 814 ng/dL (ref 264–916)

## 2021-12-10 LAB — PSA: Prostate Specific Ag, Serum: 0.6 ng/mL (ref 0.0–4.0)

## 2021-12-13 NOTE — Progress Notes (Signed)
12/14/2021 10:28 AM   [name] May 11, 1951 [id number]  Referring provider: Tracie Harrier, [facility] [place],  [place] [address]  Urological history 1. Nephrolithiasis -left URS 2021 -KUB 06/2021 - negative for stone  2. Testosterone deficiency -contributing factors of age, diabetes and obesity -testosterone, 12/09/2021 - 814 -H & H, 12/09/2021 - 15.9/47.8 -testosterone cypionate 200 mg/mL, 1 cc every 14 days   3. ED -contributing factors of age, diabetes, obesity and CAD -SHIM 15 -managed with tadalafil 20 mg, on-demand-dosing  4. BPH with LU TS -PSA, 12/09/2021, 0.6 -I PSS 1/1  Chief Complaint  Patient presents with   Benign Prostatic Hypertrophy   Erectile Dysfunction   Testosterone deficiency    HPI: Jason Harrison is a 71 y.o. male who presents today for three month follow up.   He has no urinary complaints.  Patient denies any modifying or aggravating factors.  Patient denies any gross hematuria, dysuria or suprapubic/flank pain.  Patient denies any fevers, chills, nausea or vomiting.    IPSS     Row Name 12/14/21 1000         International Prostate Symptom Score   How often have you had the sensation of not emptying your bladder? Not at All     How often have you had to urinate less than every two hours? Less than 1 in 5 times     How often have you found you stopped and started again several times when you urinated? Not at All     How often have you found it difficult to postpone urination? Not at All     How often have you had a weak urinary stream? Not at All     How often have you had to strain to start urination? Not at All     How many times did you typically get up at night to urinate? None     Total IPSS Score 1       Quality of Life due to urinary symptoms   If you were to spend the rest of your life with your urinary condition just the way it is now how would you feel about that? Pleased                Score:  1-7 Mild 8-19 Moderate 20-35 Severe   Patient still having spontaneous erections.  He denies any pain or curvature with erections.  He is taking 1 1/2 tablet of the 20 mg tadalafil for effective.    SHIM     Row Name 12/14/21 1003         SHIM: Over the last 6 months:   How do you rate your confidence that you could get and keep an erection? Low     When you had erections with sexual stimulation, how often were your erections hard enough for penetration (entering your partner)? Sometimes (about half the time)     During sexual intercourse, how often were you able to maintain your erection after you had penetrated (entered) your partner? Sometimes (about half the time)     During sexual intercourse, how difficult was it to maintain your erection to completion of intercourse? Difficult     When you attempted sexual intercourse, how often was it satisfactory for you? Most Times (much more than half the time)       SHIM Total Score   SHIM 15  Score: 1-7 Severe ED 8-11 Moderate ED 12-16 Mild-Moderate ED 17-21 Mild ED 22-25 No ED     PMH: Past Medical History:  Diagnosis Date   Actinic keratosis    Diabetes mellitus without complication ([place])    type 2   Diabetic nephropathy (HCC)    Diabetic retinopathy ([place])    Myocardial infarction ([place]) 2016   ONE STENT    Surgical History: Past Surgical History:  Procedure Laterality Date   CARDIAC CATHETERIZATION N/A 01/15/2015   Procedure: Left Heart Cath and Coronary Angiography;  Surgeon: Teodoro Spray, MD;  Location: [facility];  Service: Cardiovascular;  Laterality: N/A;   CARDIAC CATHETERIZATION N/A 01/15/2015   Procedure: Coronary Stent Intervention;  Surgeon: Wellington Hampshire, MD;  Location: [place];  Service: Cardiovascular;  Laterality: N/A;   COLONOSCOPY WITH PROPOFOL N/A 04/17/2018   Procedure: COLONOSCOPY WITH PROPOFOL;  Surgeon: Lollie Sails, MD;   Location: [facility];  Service: Endoscopy;  Laterality: N/A;   CORONARY ANGIOPLASTY     CYSTECTOMY  1972   CYSTOSCOPY WITH STENT PLACEMENT Left 07/11/2019   Procedure: CYSTOSCOPY WITH STENT PLACEMENT;  Surgeon: Hollice Espy, MD;  Location: [facility] OR[facility];  Service: Urology;  Laterality: Left;   CYSTOSCOPY/URETEROSCOPY/HOLMIUM LASER/STENT PLACEMENT Left 07/22/2019   Procedure: CYSTOSCOPY/URETEROSCOPY/HOLMIUM LASER/STENT Exchange;  Surgeon: Hollice Espy, MD;  Location: [facility] OR[facility];  Service: Urology;  Laterality: Left;   EYE SURGERY      Home Medications:  Allergies as of 12/14/2021       Reactions   Trulicity [dulaglutide] Itching, Swelling        Medication List        Accurate as of December 14, 2021 10:28 AM. If you have any questions, ask your nurse or doctor.          STOP taking these medications    sildenafil 100 MG tablet Commonly known as: VIAGRA       TAKE these medications    Alpha-Lipoic Acid 600 MG Caps Take 600 mg by mouth daily.   aspirin EC 81 MG tablet Take 81 mg by mouth every evening.   atorvastatin 20 MG tablet Commonly known as: LIPITOR Take 20 mg by mouth every evening.   B-12 5000 MCG Caps Take 5,000 mcg by mouth daily.   BD Pen Needle Nano 2nd Gen 32G X 4 MM Misc Generic drug: Insulin Pen Needle USE 1 PEN NEEDLE ONCE DAILY   Biotin 10000 MCG Tabs Take 10,000 mcg by mouth daily.   brimonidine 0.2 % ophthalmic solution Commonly known as: ALPHAGAN Place 1 drop into the right eye 2 (two) times daily.   cetirizine 10 MG tablet Commonly known as: ZYRTEC Take 10 mg by mouth every morning.   CHROMIUM PICOLATE PO Take 5,000 mcg by mouth daily.   dorzolamide-timolol 22.3-6.8 MG/ML ophthalmic solution Commonly known as: COSOPT Place 1 drop into the right eye 2 (two) times daily.   Fish Oil 1000 MG Caps Take 1,000 mg by mouth daily.   fluorometholone 0.1 % ophthalmic suspension Commonly known as: FML 1 drop daily.   glimepiride 2 MG  tablet Commonly known as: AMARYL TAKE 1 TABLET(2 MG) BY MOUTH DAILY WITH BREAKFAST   HYDROcodone-acetaminophen 5-325 MG tablet Commonly known as: NORCO/VICODIN Take 1-2 tablets by mouth every 6 (six) hours as needed for moderate pain or severe pain.   ibuprofen 200 MG tablet Commonly known as: ADVIL Take 200-600 mg by mouth every 6 (six) hours as needed for moderate pain.  Invokana 300 MG Tabs tablet Generic drug: canagliflozin Take 150 mg by mouth daily.   latanoprost 0.005 % ophthalmic solution Commonly known as: XALATAN Place 1 drop into the right eye at bedtime.   LUBRICATING EYE DROPS OP Place 1 drop into both eyes 2 (two) times daily.   melatonin 5 MG Tabs Take 2.5 mg by mouth at bedtime as needed (sleep).   metFORMIN 1000 MG tablet Commonly known as: GLUCOPHAGE Take 1,000 mg by mouth 2 (two) times daily.   multivitamin with minerals Tabs tablet Take 1 tablet by mouth daily.   ondansetron 4 MG disintegrating tablet Commonly known as: Zofran ODT Take 1 tablet (4 mg total) by mouth every 6 (six) hours as needed for nausea or vomiting.   OVER THE COUNTER MEDICATION Take 1 capsule by mouth daily. Saffron supplement   Ozempic (0.25 or 0.5 MG/DOSE) 2 MG/1.5ML Sopn Generic drug: Semaglutide(0.25 or 0.5MG/DOS) Inject 0.5 mg into the skin once a week.   pioglitazone 15 MG tablet Commonly known as: ACTOS Take 15 mg by mouth daily.   PreserVision AREDS 2 Caps Take 1 capsule by mouth 2 (two) times daily.   Probiotic Caps Take 1 capsule by mouth daily.   tadalafil 20 MG tablet Commonly known as: CIALIS Take 1 tablet (20 mg total) by mouth daily as needed.   testosterone cypionate 200 MG/ML injection Commonly known as: DEPOTESTOSTERONE CYPIONATE Inject 1 mL (200 mg total) into the muscle every 14 (fourteen) days.   ticagrelor 90 MG Tabs tablet Commonly known as: BRILINTA Take 1 tablet (90 mg total) by mouth 2 (two) times daily.   TURMERIC PO Take 320 mg by  mouth daily.   valsartan 160 MG tablet Commonly known as: DIOVAN Take 1 tablet (160 mg total) by mouth daily.   Victoza 18 MG/3ML Sopn Generic drug: liraglutide Inject 1.8 mg into the skin at bedtime.   vitamin C 1000 MG tablet Take 1,000 mg by mouth daily.   Vitamin D 50 MCG (2000 UT) tablet Take 2,000 Units by mouth daily.   VITAMIN K PO Take 1 capsule by mouth daily.        Allergies:  Allergies  Allergen Reactions   Trulicity [Dulaglutide] Itching and Swelling    Family History: No family history on file.  Social History:  reports that he has never smoked. He has never used smokeless tobacco. He reports that he does not drink alcohol and does not use drugs.  ROS: Pertinent ROS in HPI  Physical Exam: BP (!) 146/80   Pulse 93   Ht _0  (1.778 m)   Wt 205 lb (93 kg)   BMI 29.41 kg/m   Constitutional:  Well nourished. Alert and oriented, No acute distress. HEENT: [place] AT, moist mucus membranes.  Trachea midline Cardiovascular: No clubbing, cyanosis, or edema. Respiratory: Normal respiratory effort, no increased work of breathing. GU: No CVA tenderness.  No bladder fullness or masses.  Patient with circumcised phallus.  Urethral meatus is patent.  No penile discharge. No penile lesions or rashes. Scrotum without lesions, cysts, rashes and/or edema.  Testicles are located scrotally bilaterally. No masses are appreciated in the testicles. Left and right epididymis are normal. Rectal: Patient with  normal sphincter tone. Anus and perineum without scarring or rashes. No rectal masses are appreciated. Prostate is approximately 50 grams, no nodules are appreciated. Seminal vesicles could not be palpated Neurologic: Grossly intact, no focal deficits, moving all 4 extremities. Psychiatric: Normal mood and affect.   Laboratory Data:  Lab Results  Component Value Date   TESTOSTERONE 814 12/09/2021   Component     Latest Ref Rng 12/09/2021  Hemoglobin     13.0 - 17.7  g/dL 15.9   HCT     37.5 - 51.0 % 47.8     Prostate Specific Ag, Serum  Latest Ref Rng 0.0 - 4.0 ng/mL  05/21/2020 0.7   05/31/2021 1.0   12/09/2021 0.6    Thyroid Stimulating Hormone (TSH) 0.450-5.330 uIU/ml uIU/mL 2.459   Resulting [place] - LAB   Specimen Collected: 10/15/21 08:43 Last Resulted: 10/15/21 10:45  Received From: [facility]  Result Received: 10/27/21 11:35   Hemoglobin A1C 4.2 - 5.6 % 7.1 High    Average Blood Glucose (Calc) mg/dL [facility] - [facility]  Narrative Performed by [place] - LAB Normal Range:    4.2 - 5.6%  Increased Risk:  5.7 - 6.4%  Diabetes:        >= 6.5%  Glycemic Control for adults with diabetes:  <7%    Specimen Collected: 10/15/21 08:43 Last Resulted: 10/15/21 11:19  Received From: [facility]  Result Received: 10/27/21 11:35   Color Yellow, Violet, Light Violet, Dark Violet Yellow   Clarity Clear, Other Clear   Specific Gravity 1.000 - 1.030 1.020   pH, Urine 5.0 - 8.0 6.0   Protein, Urinalysis Negative, Trace mg/dL Negative   Glucose, Urinalysis Negative mg/dL >=1000 Abnormal    Ketones, Urinalysis Negative mg/dL Negative   Blood, Urinalysis Negative Negative   Nitrite, Urinalysis Negative Negative   Leukocyte Esterase, Urinalysis Negative Negative   White Blood Cells, Urinalysis None Seen, 0-3 /hpf None Seen   Red Blood Cells, Urinalysis None Seen, 0-3 /hpf None Seen   Bacteria, Urinalysis None Seen /hpf None Seen   Squamous Epithelial Cells, Urinalysis Rare, Few, None Seen /hpf None Seen   Resulting Agency  [facility] - [facility]   Specimen Collected: 10/15/21 08:43 Last Resulted: 10/15/21 10:06  Received From: [facility]  Result Received: 10/27/21 11:35   Cholesterol, Total 100 - 200 mg/dL 122   Triglyceride 35 - 199 mg/dL 141   HDL (High Density Lipoprotein) Cholesterol 29.0 - 71.0 mg/dL 39.5   LDL  Calculated 0 - 130 mg/dL 54   VLDL Cholesterol mg/dL 28   Cholesterol/HDL Ratio  3.1   Resulting Agency  [facility] - LAB   Specimen Collected: 10/15/21 08:43 Last Resulted: 10/15/21 12:04  Received From: [facility]  Result Received: 10/27/21 11:35   Glucose 70 - 110 mg/dL 112 High    Sodium 136 - 145 mmol/L 142   Potassium 3.6 - 5.1 mmol/L 4.6   Chloride 97 - 109 mmol/L 107   Carbon Dioxide (CO2) 22.0 - 32.0 mmol/L 24.4   Urea Nitrogen (BUN) 7 - 25 mg/dL 18   Creatinine 0.7 - 1.3 mg/dL 1.0   Glomerular Filtration Rate (eGFR), MDRD Estimate >60 mL/min/1.73sq m 74   Calcium 8.7 - 10.3 mg/dL 9.7   AST  8 - 39 U/L 13   ALT  6 - 57 U/L 19   Alk Phos (alkaline Phosphatase) 34 - 104 U/L 49   Albumin 3.5 - 4.8 g/dL 4.4   Bilirubin, Total 0.3 - 1.2 mg/dL 0.6   Protein, Total 6.1 - 7.9 g/dL 6.8   A/G Ratio 1.0 - 5.0 gm/dL 1.8   Resulting Agency  [facility]   Specimen Collected: 10/15/21 08:43 Last Resulted: 10/15/21 12:03  Received From: [facility]  Result Received: 10/27/21 11:35  I have reviewed the labs.   Pertinent Imaging: N/A  Assessment & Plan:    1. Testosterone deficiency -testosterone levels are therapeutic -H & H WNL -LFT's WNL -lipids normal -continue testosterone cypionate 200 mg/mL, 1 cc every 14 days    2. BPH with LUTS -PSA stable -DRE benign -continue conservative management, avoiding bladder irritants and timed voiding's   3. ED -try tadalafil 20 mg daily  Return in about 6 months (around 06/15/2022) for PSA, testosterone (one week after injection) H & H.  These notes generated with voice recognition software. I apologize for typographical errors.  Zara Council, PA-C  [facility] [address]  [place], [place] [address] [phone]

## 2021-12-14 ENCOUNTER — Other Ambulatory Visit: Payer: Self-pay

## 2021-12-14 ENCOUNTER — Ambulatory Visit (INDEPENDENT_AMBULATORY_CARE_PROVIDER_SITE_OTHER): Payer: Medicare Other | Admitting: Urology

## 2021-12-14 ENCOUNTER — Encounter: Payer: Self-pay | Admitting: Urology

## 2021-12-14 VITALS — BP 146/80 | HR 93 | Ht 70.0 in | Wt 205.0 lb

## 2021-12-14 DIAGNOSIS — E349 Endocrine disorder, unspecified: Secondary | ICD-10-CM

## 2021-12-14 DIAGNOSIS — E291 Testicular hypofunction: Secondary | ICD-10-CM

## 2021-12-14 DIAGNOSIS — N529 Male erectile dysfunction, unspecified: Secondary | ICD-10-CM | POA: Diagnosis not present

## 2021-12-14 DIAGNOSIS — N401 Enlarged prostate with lower urinary tract symptoms: Secondary | ICD-10-CM | POA: Diagnosis not present

## 2021-12-14 DIAGNOSIS — N400 Enlarged prostate without lower urinary tract symptoms: Secondary | ICD-10-CM

## 2021-12-14 MED ORDER — TADALAFIL 20 MG PO TABS: 20.0000 mg | ORAL_TABLET | Freq: Every day | ORAL | 3 refills | Status: DC | PRN

## 2021-12-14 MED ORDER — TESTOSTERONE CYPIONATE 200 MG/ML IM SOLN: 200.0000 mg | INTRAMUSCULAR | 0 refills | Status: DC

## 2022-01-12 ENCOUNTER — Ambulatory Visit: Payer: Medicare Other | Admitting: Dermatology

## 2022-05-16 ENCOUNTER — Other Ambulatory Visit: Payer: Self-pay | Admitting: Urology

## 2022-05-16 DIAGNOSIS — E349 Endocrine disorder, unspecified: Secondary | ICD-10-CM

## 2022-06-13 ENCOUNTER — Other Ambulatory Visit: Payer: Medicare Other

## 2022-06-13 DIAGNOSIS — E349 Endocrine disorder, unspecified: Secondary | ICD-10-CM

## 2022-06-13 DIAGNOSIS — N400 Enlarged prostate without lower urinary tract symptoms: Secondary | ICD-10-CM

## 2022-06-14 ENCOUNTER — Telehealth: Payer: Self-pay

## 2022-06-14 LAB — PSA: Prostate Specific Ag, Serum: 0.6 ng/mL (ref 0.0–4.0)

## 2022-06-14 LAB — TESTOSTERONE: Testosterone: 316 ng/dL (ref 264–916)

## 2022-06-14 LAB — HEMOGLOBIN AND HEMATOCRIT, BLOOD
Hematocrit: 46.9 % (ref 37.5–51.0)
Hemoglobin: 15.8 g/dL (ref 13.0–17.7)

## 2022-06-14 NOTE — Telephone Encounter (Signed)
-----   Message from Harle Battiest, PA-C sent at 06/14/2022  8:05 AM EST ----- Please let Jason Harrison know that his labs look good and to continue his present dose of testosterone.  I will need to see him 6 months for I PSS, SHIM and exam with PSA, hemoglobin and hematocrit and testosterone level (one week after an injection)

## 2022-06-14 NOTE — Telephone Encounter (Signed)
Notified pt as advised. 6 mo lab and OV appts booked according to injection schedule, pt confirmed and expressed understanding.

## 2022-10-05 ENCOUNTER — Other Ambulatory Visit: Payer: Self-pay | Admitting: Urology

## 2022-10-05 DIAGNOSIS — N529 Male erectile dysfunction, unspecified: Secondary | ICD-10-CM

## 2022-10-05 DIAGNOSIS — E349 Endocrine disorder, unspecified: Secondary | ICD-10-CM

## 2022-10-05 NOTE — Telephone Encounter (Signed)
Pt needs a testosterone refill and Cialis refill sent to [facility] at [facility] [address], until his appt in June.

## 2022-10-09 MED ORDER — TADALAFIL 20 MG PO TABS
20.0000 mg | ORAL_TABLET | Freq: Every day | ORAL | 3 refills | Status: DC | PRN
Start: 2022-10-09 — End: 2023-02-20

## 2022-10-09 MED ORDER — TESTOSTERONE CYPIONATE 200 MG/ML IM SOLN
INTRAMUSCULAR | 0 refills | Status: DC
Start: 2022-10-09 — End: 2023-02-23

## 2022-11-02 ENCOUNTER — Ambulatory Visit (INDEPENDENT_AMBULATORY_CARE_PROVIDER_SITE_OTHER): Payer: Medicare Other | Admitting: Dermatology

## 2022-11-02 VITALS — BP 148/89 | HR 79

## 2022-11-02 DIAGNOSIS — L821 Other seborrheic keratosis: Secondary | ICD-10-CM

## 2022-11-02 DIAGNOSIS — D2271 Melanocytic nevi of right lower limb, including hip: Secondary | ICD-10-CM | POA: Diagnosis not present

## 2022-11-02 DIAGNOSIS — L578 Other skin changes due to chronic exposure to nonionizing radiation: Secondary | ICD-10-CM | POA: Diagnosis not present

## 2022-11-02 DIAGNOSIS — L570 Actinic keratosis: Secondary | ICD-10-CM

## 2022-11-02 DIAGNOSIS — D229 Melanocytic nevi, unspecified: Secondary | ICD-10-CM

## 2022-11-02 NOTE — Patient Instructions (Signed)
Cryotherapy Aftercare  Wash gently with soap and water everyday.   Apply Vaseline and Band-Aid daily until healed.     Due to recent changes in healthcare laws, you may see results of your pathology and/or laboratory studies on MyChart before the doctors have had a chance to review them. We understand that in some cases there may be results that are confusing or concerning to you. Please understand that not all results are received at the same time and often the doctors may need to interpret multiple results in order to provide you with the best plan of care or course of treatment. Therefore, we ask that you please give us 2 business days to thoroughly review all your results before contacting the office for clarification. Should we see a critical lab result, you will be contacted sooner.   If You Need Anything After Your Visit  If you have any questions or concerns for your doctor, please call our main line at [phone] and press option 4 to reach your doctor's medical assistant. If no one answers, please leave a voicemail as directed and we will return your call as soon as possible. Messages left after 4 pm will be answered the following business day.   You may also send us a message via MyChart. We typically respond to MyChart messages within 1-2 business days.  For prescription refills, please ask your pharmacy to contact our office. Our fax number is [phone].  If you have an urgent issue when the clinic is closed that cannot wait until the next business day, you can page your doctor at the number below.    Please note that while we do our best to be available for urgent issues outside of office hours, we are not available 24/7.   If you have an urgent issue and are unable to reach us, you may choose to seek medical care at your doctor's office, retail clinic, urgent care center, or emergency room.  If you have a medical emergency, please immediately call 911 or go to the  emergency department.  Pager Numbers  - Dr. Kowalski: [phone]  - Dr. Moye: [phone]  - Dr. Stewart: [phone]  In the event of inclement weather, please call our main line at [phone] for an update on the status of any delays or closures.  Dermatology Medication Tips: Please keep the boxes that topical medications come in in order to help keep track of the instructions about where and how to use these. Pharmacies typically print the medication instructions only on the boxes and not directly on the medication tubes.   If your medication is too expensive, please contact our office at [phone] option 4 or send us a message through MyChart.   We are unable to tell what your co-pay for medications will be in advance as this is different depending on your insurance coverage. However, we may be able to find a substitute medication at lower cost or fill out paperwork to get insurance to cover a needed medication.   If a prior authorization is required to get your medication covered by your insurance company, please allow us 1-2 business days to complete this process.  Drug prices often vary depending on where the prescription is filled and some pharmacies may offer cheaper prices.  The website www.goodrx.com contains coupons for medications through different pharmacies. The prices here do not account for what the cost may be with help from insurance (it may be cheaper with your insurance), but the website can   give you the price if you did not use any insurance.  - You can print the associated coupon and take it with your prescription to the pharmacy.  - You may also stop by our office during regular business hours and pick up a GoodRx coupon card.  - If you need your prescription sent electronically to a different pharmacy, notify our office through  MyChart or by phone at [phone] option 4.     Si Usted Necesita Algo Despus de Su Visita  Tambin puede  enviarnos un mensaje a travs de MyChart. Por lo general respondemos a los mensajes de MyChart en el transcurso de 1 a 2 das hbiles.  Para renovar recetas, por favor pida a su farmacia que se ponga en contacto con nuestra oficina. Nuestro nmero de fax es el [phone].  Si tiene un asunto urgente cuando la clnica est cerrada y que no puede esperar hasta el siguiente da hbil, puede llamar/localizar a su doctor(a) al nmero que aparece a continuacin.   Por favor, tenga en cuenta que aunque hacemos todo lo posible para estar disponibles para asuntos urgentes fuera del horario de oficina, no estamos disponibles las 24 horas del da, los 7 das de la semana.   Si tiene un problema urgente y no puede comunicarse con nosotros, puede optar por buscar atencin mdica  en el consultorio de su doctor(a), en una clnica privada, en un centro de atencin urgente o en una sala de emergencias.  Si tiene una emergencia mdica, por favor llame inmediatamente al 911 o vaya a la sala de emergencias.  Nmeros de bper  - Dr. Kowalski: [phone]  - Dra. Moye: [phone]  - Dra. Stewart: [phone]  En caso de inclemencias del tiempo, por favor llame a nuestra lnea principal al [phone] para una actualizacin sobre el estado de cualquier retraso o cierre.  Consejos para la medicacin en dermatologa: Por favor, guarde las cajas en las que vienen los medicamentos de uso tpico para ayudarle a seguir las instrucciones sobre dnde y cmo usarlos. Las farmacias generalmente imprimen las instrucciones del medicamento slo en las cajas y no directamente en los tubos del medicamento.   Si su medicamento es muy caro, por favor, pngase en contacto con nuestra oficina llamando al [phone] y presione la opcin 4 o envenos un mensaje a travs de MyChart.   No podemos decirle cul ser su copago por los medicamentos por adelantado ya que esto es diferente dependiendo de la cobertura de su seguro.  Sin embargo, es posible que podamos encontrar un medicamento sustituto a menor costo o llenar un formulario para que el seguro cubra el medicamento que se considera necesario.   Si se requiere una autorizacin previa para que su compaa de seguros cubra su medicamento, por favor permtanos de 1 a 2 das hbiles para completar este proceso.  Los precios de los medicamentos varan con frecuencia dependiendo del lugar de dnde se surte la receta y alguna farmacias pueden ofrecer precios ms baratos.  El sitio web www.goodrx.com tiene cupones para medicamentos de diferentes farmacias. Los precios aqu no tienen en cuenta lo que podra costar con la ayuda del seguro (puede ser ms barato con su seguro), pero el sitio web puede darle el precio si no utiliz ningn seguro.  - Puede imprimir el cupn correspondiente y llevarlo con su receta a la farmacia.  - Tambin puede pasar por nuestra oficina durante el horario de atencin regular y recoger una tarjeta de cupones de GoodRx.  -   Si necesita que su receta se enve electrnicamente a una farmacia diferente, informe a nuestra oficina a travs de MyChart de [place] o por telfono llamando al [phone] y presione la opcin 4.  

## 2022-11-02 NOTE — Progress Notes (Signed)
   Follow-Up Visit   Subjective  Jason Harrison is a 72 y.o. male who presents for the following: Actinic keratosis yealy f/u. The patient has spots on his face and ears to be evaluated, some may be new or changing and the patient may have concern these could be cancer.   The following portions of the chart were reviewed this encounter and updated as appropriate: medications, allergies, medical history  Review of Systems:  No other skin or systemic complaints except as noted in HPI or Assessment and Plan.  Objective  Well appearing patient in no apparent distress; mood and affect are within normal limits. A focused examination was performed of the following areas:face,scalp,ears, legs,back,arms Relevant exam findings are noted in the Assessment and Plan.  right forehead x 1, left arm x 1, right nose x 2 (4) Erythematous thin papules/macules with gritty scale.    Assessment & Plan   AK (actinic keratosis) (4) right forehead x 1, left arm x 1, right nose x 2  Actinic keratoses are precancerous spots that appear secondary to cumulative UV radiation exposure/sun exposure over time. They are chronic with expected duration over 1 year. A portion of actinic keratoses will progress to squamous cell carcinoma of the skin. It is not possible to reliably predict which spots will progress to skin cancer and so treatment is recommended to prevent development of skin cancer.  Recommend daily broad spectrum sunscreen SPF 30+ to sun-exposed areas, reapply every 2 hours as needed.  Recommend staying in the shade or wearing long sleeves, sun glasses (UVA+UVB protection) and wide brim hats (4-inch brim around the entire circumference of the hat). Call for new or changing lesions.   Destruction of lesion - right forehead x 1, left arm x 1, right nose x 2 Complexity: simple   Destruction method: cryotherapy   Informed consent: discussed and consent obtained   Timeout:  patient name, date of birth,  surgical site, and procedure verified Lesion destroyed using liquid nitrogen: Yes   Region frozen until ice ball extended beyond lesion: Yes   Outcome: patient tolerated procedure well with no complications   Post-procedure details: wound care instructions given    ACTINIC DAMAGE - chronic, secondary to cumulative UV radiation exposure/sun exposure over time - diffuse scaly erythematous macules with underlying dyspigmentation - Recommend daily broad spectrum sunscreen SPF 30+ to sun-exposed areas, reapply every 2 hours as needed.  - Recommend staying in the shade or wearing long sleeves, sun glasses (UVA+UVB protection) and wide brim hats (4-inch brim around the entire circumference of the hat). - Call for new or changing lesions.   SEBORRHEIC KERATOSIS - Stuck-on, waxy, tan-brown papules and/or plaques  - Benign-appearing - Discussed benign etiology and prognosis. - Observe - Call for any changes  NEVUS  Right calf  Exam: 0.4 cm regular brown macule   Treatment Plan: Benign appearing on exam today. Recommend observation. Call clinic for new or changing moles. Recommend daily use of broad spectrum spf 30+ sunscreen to sun-exposed areas.    Return in about 1 year (around 11/02/2023) for Aks .  IAngelique Holm, CMA, am acting as scribe for Armida Sans, MD .   Documentation: I have reviewed the above documentation for accuracy and completeness, and I agree with the above.  Armida Sans, MD

## 2022-11-08 ENCOUNTER — Encounter: Payer: Self-pay | Admitting: Dermatology

## 2022-12-05 ENCOUNTER — Other Ambulatory Visit: Payer: Self-pay | Admitting: Family Medicine

## 2022-12-05 DIAGNOSIS — E291 Testicular hypofunction: Secondary | ICD-10-CM

## 2022-12-05 DIAGNOSIS — N400 Enlarged prostate without lower urinary tract symptoms: Secondary | ICD-10-CM

## 2022-12-08 ENCOUNTER — Other Ambulatory Visit: Payer: Medicare Other

## 2022-12-08 DIAGNOSIS — E291 Testicular hypofunction: Secondary | ICD-10-CM

## 2022-12-08 DIAGNOSIS — N400 Enlarged prostate without lower urinary tract symptoms: Secondary | ICD-10-CM

## 2022-12-09 LAB — TESTOSTERONE: Testosterone: 1040 ng/dL — ABNORMAL HIGH (ref 264–916)

## 2022-12-09 LAB — PSA: Prostate Specific Ag, Serum: 0.7 ng/mL (ref 0.0–4.0)

## 2022-12-09 LAB — HEMOGLOBIN: Hemoglobin: 15.6 g/dL (ref 13.0–17.7)

## 2022-12-09 LAB — HEMATOCRIT: Hematocrit: 49.9 % (ref 37.5–51.0)

## 2022-12-12 NOTE — Progress Notes (Unsigned)
12/14/2021 10:28 AM   Jason Harrison December 21, 1950 [id number]  Referring provider: Barbette Reichmann, MD [address] [facility] [place],  [place] [address]  Urological history 1. Nephrolithiasis -Stone composition 100% calcium oxalate monohydrate -left URS 2021 -KUB (06/2021) - negative for stone  2. Testosterone deficiency -contributing factors of age, diabetes and obesity -testosterone (12/2022) - 1040  -Hemoglobin/hematocrit (12/2022) 15.6/49.9 -testosterone cypionate 200 mg/mL, 1 cc every 14 days   3. ED -contributing factors of age, diabetes, obesity and CAD -tadalafil 20 mg, on-demand-dosing  4. BPH with LU TS -PSA (12/2022) - 0.7  Chief Complaint  Patient presents with   Benign Prostatic Hypertrophy   Erectile Dysfunction   Testosterone deficiency    HPI: Jason Harrison is a 72 y.o. male who presents today for three month follow up.   He has no urinary complaints.  Patient denies any modifying or aggravating factors.  Patient denies any gross hematuria, dysuria or suprapubic/flank pain.  Patient denies any fevers, chills, nausea or vomiting.    IPSS     Row Name 12/14/21 1000         International Prostate Symptom Score   How often have you had the sensation of not emptying your bladder? Not at All     How often have you had to urinate less than every two hours? Less than 1 in 5 times     How often have you found you stopped and started again several times when you urinated? Not at All     How often have you found it difficult to postpone urination? Not at All     How often have you had a weak urinary stream? Not at All     How often have you had to strain to start urination? Not at All     How many times did you typically get up at night to urinate? None     Total IPSS Score 1       Quality of Life due to urinary symptoms   If you were to spend the rest of your life with your urinary condition just the way it is now how would you feel  about that? Pleased               Score:  1-7 Mild 8-19 Moderate 20-35 Severe   Patient still having spontaneous erections.  He denies any pain or curvature with erections.  He is taking 1 1/2 tablet of the 20 mg tadalafil for effective.    SHIM     Row Name 12/14/21 1003         SHIM: Over the last 6 months:   How do you rate your confidence that you could get and keep an erection? Low     When you had erections with sexual stimulation, how often were your erections hard enough for penetration (entering your partner)? Sometimes (about half the time)     During sexual intercourse, how often were you able to maintain your erection after you had penetrated (entered) your partner? Sometimes (about half the time)     During sexual intercourse, how difficult was it to maintain your erection to completion of intercourse? Difficult     When you attempted sexual intercourse, how often was it satisfactory for you? Most Times (much more than half the time)       SHIM Total Score   SHIM 15  Score: 1-7 Severe ED 8-11 Moderate ED 12-16 Mild-Moderate ED 17-21 Mild ED 22-25 No ED     PMH: Past Medical History:  Diagnosis Date   Actinic keratosis    Diabetes mellitus without complication (HCC)    type 2   Diabetic nephropathy (HCC)    Diabetic retinopathy (HCC)    Myocardial infarction (HCC) 2016   ONE STENT    Surgical History: Past Surgical History:  Procedure Laterality Date   CARDIAC CATHETERIZATION N/A 01/15/2015   Procedure: Left Heart Cath and Coronary Angiography;  Surgeon: Dalia Heading, MD;  Location: [facility] INVASIVE CV LAB;  Service: Cardiovascular;  Laterality: N/A;   CARDIAC CATHETERIZATION N/A 01/15/2015   Procedure: Coronary Stent Intervention;  Surgeon: Iran Ouch, MD;  Location: [facility] INVASIVE CV LAB;  Service: Cardiovascular;  Laterality: N/A;   COLONOSCOPY WITH PROPOFOL N/A 04/17/2018   Procedure: COLONOSCOPY WITH PROPOFOL;  Surgeon:  Christena Deem, MD;  Location: [facility] ENDOSCOPY;  Service: Endoscopy;  Laterality: N/A;   CORONARY ANGIOPLASTY     CYSTECTOMY  1972   CYSTOSCOPY WITH STENT PLACEMENT Left 07/11/2019   Procedure: CYSTOSCOPY WITH STENT PLACEMENT;  Surgeon: Vanna Scotland, MD;  Location: [facility] ORS;  Service: Urology;  Laterality: Left;   CYSTOSCOPY/URETEROSCOPY/HOLMIUM LASER/STENT PLACEMENT Left 07/22/2019   Procedure: CYSTOSCOPY/URETEROSCOPY/HOLMIUM LASER/STENT Exchange;  Surgeon: Vanna Scotland, MD;  Location: [facility] ORS;  Service: Urology;  Laterality: Left;   EYE SURGERY      Home Medications:  Allergies as of 12/14/2021       Reactions   Trulicity [dulaglutide] Itching, Swelling        Medication List        Accurate as of December 14, 2021 10:28 AM. If you have any questions, ask your nurse or doctor.          STOP taking these medications    sildenafil 100 MG tablet Commonly known as: VIAGRA       TAKE these medications    Alpha-Lipoic Acid 600 MG Caps Take 600 mg by mouth daily.   aspirin EC 81 MG tablet Take 81 mg by mouth every evening.   atorvastatin 20 MG tablet Commonly known as: LIPITOR Take 20 mg by mouth every evening.   B-12 5000 MCG Caps Take 5,000 mcg by mouth daily.   BD Pen Needle Nano 2nd Gen 32G X 4 MM Misc Generic drug: Insulin Pen Needle USE 1 PEN NEEDLE ONCE DAILY   Biotin 96045 MCG Tabs Take 10,000 mcg by mouth daily.   brimonidine 0.2 % ophthalmic solution Commonly known as: ALPHAGAN Place 1 drop into the right eye 2 (two) times daily.   cetirizine 10 MG tablet Commonly known as: ZYRTEC Take 10 mg by mouth every morning.   CHROMIUM PICOLATE PO Take 5,000 mcg by mouth daily.   dorzolamide-timolol 22.3-6.8 MG/ML ophthalmic solution Commonly known as: COSOPT Place 1 drop into the right eye 2 (two) times daily.   Fish Oil 1000 MG Caps Take 1,000 mg by mouth daily.   fluorometholone 0.1 % ophthalmic suspension Commonly known as: FML 1 drop  daily.   glimepiride 2 MG tablet Commonly known as: AMARYL TAKE 1 TABLET(2 MG) BY MOUTH DAILY WITH BREAKFAST   HYDROcodone-acetaminophen 5-325 MG tablet Commonly known as: NORCO/VICODIN Take 1-2 tablets by mouth every 6 (six) hours as needed for moderate pain or severe pain.   ibuprofen 200 MG tablet Commonly known as: ADVIL Take 200-600 mg by mouth every 6 (six) hours as needed for moderate pain.  Invokana 300 MG Tabs tablet Generic drug: canagliflozin Take 150 mg by mouth daily.   latanoprost 0.005 % ophthalmic solution Commonly known as: XALATAN Place 1 drop into the right eye at bedtime.   LUBRICATING EYE DROPS OP Place 1 drop into both eyes 2 (two) times daily.   melatonin 5 MG Tabs Take 2.5 mg by mouth at bedtime as needed (sleep).   metFORMIN 1000 MG tablet Commonly known as: GLUCOPHAGE Take 1,000 mg by mouth 2 (two) times daily.   multivitamin with minerals Tabs tablet Take 1 tablet by mouth daily.   ondansetron 4 MG disintegrating tablet Commonly known as: Zofran ODT Take 1 tablet (4 mg total) by mouth every 6 (six) hours as needed for nausea or vomiting.   OVER THE COUNTER MEDICATION Take 1 capsule by mouth daily. Saffron supplement   Ozempic (0.25 or 0.5 MG/DOSE) 2 MG/1.5ML Sopn Generic drug: Semaglutide(0.25 or 0.5MG /DOS) Inject 0.5 mg into the skin once a week.   pioglitazone 15 MG tablet Commonly known as: ACTOS Take 15 mg by mouth daily.   PreserVision AREDS 2 Caps Take 1 capsule by mouth 2 (two) times daily.   Probiotic Caps Take 1 capsule by mouth daily.   tadalafil 20 MG tablet Commonly known as: CIALIS Take 1 tablet (20 mg total) by mouth daily as needed.   testosterone cypionate 200 MG/ML injection Commonly known as: DEPOTESTOSTERONE CYPIONATE Inject 1 mL (200 mg total) into the muscle every 14 (fourteen) days.   ticagrelor 90 MG Tabs tablet Commonly known as: BRILINTA Take 1 tablet (90 mg total) by mouth 2 (two) times daily.    TURMERIC PO Take 320 mg by mouth daily.   valsartan 160 MG tablet Commonly known as: DIOVAN Take 1 tablet (160 mg total) by mouth daily.   Victoza 18 MG/3ML Sopn Generic drug: liraglutide Inject 1.8 mg into the skin at bedtime.   vitamin C 1000 MG tablet Take 1,000 mg by mouth daily.   Vitamin D 50 MCG (2000 UT) tablet Take 2,000 Units by mouth daily.   VITAMIN K PO Take 1 capsule by mouth daily.        Allergies:  Allergies  Allergen Reactions   Trulicity [Dulaglutide] Itching and Swelling    Family History: No family history on file.  Social History:  reports that he has never smoked. He has never used smokeless tobacco. He reports that he does not drink alcohol and does not use drugs.  ROS: Pertinent ROS in HPI  Physical Exam: BP (!) 146/80   Pulse 93   Ht 5\' 10"  (1.778 m)   Wt 205 lb (93 kg)   BMI 29.41 kg/m   Constitutional:  Well nourished. Alert and oriented, No acute distress. HEENT: [place] AT, moist mucus membranes.  Trachea midline Cardiovascular: No clubbing, cyanosis, or edema. Respiratory: Normal respiratory effort, no increased work of breathing. GU: No CVA tenderness.  No bladder fullness or masses.  Patient with circumcised phallus.  Urethral meatus is patent.  No penile discharge. No penile lesions or rashes. Scrotum without lesions, cysts, rashes and/or edema.  Testicles are located scrotally bilaterally. No masses are appreciated in the testicles. Left and right epididymis are normal. Rectal: Patient with  normal sphincter tone. Anus and perineum without scarring or rashes. No rectal masses are appreciated. Prostate is approximately 50 grams, no nodules are appreciated. Seminal vesicles could not be palpated Neurologic: Grossly intact, no focal deficits, moving all 4 extremities. Psychiatric: Normal mood and affect.   Laboratory Data:  Serum creatinine (08/2022) 1.0, eGFR 81 Hemoglobin A1c (08/2022) 8.2  Urinalysis w/Microscopic Order:  [id number] Component Ref Range & Units 3 mo ago  Color Colorless, Straw, Light Yellow, Yellow, Dark Yellow Dark Yellow  Clarity Clear Clear  Specific Gravity 1.005 - 1.030 >1.030 High   pH, Urine 5.0 - 8.0 6.0  Protein, Urinalysis Negative mg/dL Negative  Glucose, Urinalysis Negative mg/dL 4+ Abnormal   Ketones, Urinalysis Negative mg/dL 1+ Abnormal   Blood, Urinalysis Negative Negative  Nitrite, Urinalysis Negative Negative  Leukocyte Esterase, Urinalysis Negative Negative  Bilirubin, Urinalysis Negative Negative  Urobilinogen, Urinalysis 0.2 - 1.0 mg/dL 0.2  WBC, UA <=5 /hpf 1  Red Blood Cells, Urinalysis <=3 /hpf 0  Bacteria, Urinalysis 0 - 5 /hpf 0-5  Squamous Epithelial Cells, Urinalysis /hpf 0  Resulting Agency [facility]   Specimen Collected: 08/24/22 09:13   Performed by: [facility] Last Resulted: 08/24/22 10:09  Received From: [facility]  Result Received: 10/05/22 15:03  I have reviewed the labs.   Pertinent Imaging: N/A  Assessment & Plan:    1. Testosterone deficiency -testosterone levels are therapeutic -Hemoglobin/hematocrits normal -continue testosterone cypionate 200 mg/mL, 1 cc every 14 days    2. BPH with LUTS -PSA stable -continue conservative management, avoiding bladder irritants and timed voiding's   3. ED -continue tadalafil 20 mg daily  Return in about 6 months (around 06/15/2022) for PSA, testosterone (one week after injection) H & H.  These notes generated with voice recognition software. I apologize for typographical errors.  Cloretta Ned  [facility] [address]  [address] [place], [place] [address] [phone]

## 2022-12-13 ENCOUNTER — Encounter: Payer: Self-pay | Admitting: Urology

## 2022-12-13 ENCOUNTER — Ambulatory Visit (INDEPENDENT_AMBULATORY_CARE_PROVIDER_SITE_OTHER): Payer: Medicare Other | Admitting: Urology

## 2022-12-13 VITALS — BP 110/66 | HR 88 | Ht 70.0 in | Wt 200.0 lb

## 2022-12-13 DIAGNOSIS — E291 Testicular hypofunction: Secondary | ICD-10-CM

## 2022-12-13 DIAGNOSIS — N529 Male erectile dysfunction, unspecified: Secondary | ICD-10-CM

## 2022-12-13 DIAGNOSIS — N401 Enlarged prostate with lower urinary tract symptoms: Secondary | ICD-10-CM | POA: Diagnosis not present

## 2022-12-13 DIAGNOSIS — N400 Enlarged prostate without lower urinary tract symptoms: Secondary | ICD-10-CM

## 2022-12-22 ENCOUNTER — Other Ambulatory Visit: Payer: Self-pay | Admitting: Internal Medicine

## 2022-12-22 DIAGNOSIS — M7989 Other specified soft tissue disorders: Secondary | ICD-10-CM

## 2022-12-30 ENCOUNTER — Ambulatory Visit
Admission: RE | Admit: 2022-12-30 | Discharge: 2022-12-30 | Disposition: A | Discharge status: Home / Self Care | Payer: Medicare Other | Source: Ambulatory Visit | Attending: Internal Medicine | Admitting: Internal Medicine

## 2022-12-30 DIAGNOSIS — M7989 Other specified soft tissue disorders: Secondary | ICD-10-CM | POA: Insufficient documentation

## 2023-02-20 ENCOUNTER — Telehealth: Payer: Self-pay | Admitting: Urology

## 2023-02-20 ENCOUNTER — Other Ambulatory Visit: Payer: Self-pay | Admitting: Urology

## 2023-02-20 DIAGNOSIS — N529 Male erectile dysfunction, unspecified: Secondary | ICD-10-CM

## 2023-02-20 MED ORDER — TADALAFIL 20 MG PO TABS
20.0000 mg | ORAL_TABLET | Freq: Every day | ORAL | 3 refills | Status: DC | PRN
Start: 2023-02-20 — End: 2023-02-20

## 2023-02-20 MED ORDER — TADALAFIL 20 MG PO TABS
20.0000 mg | ORAL_TABLET | Freq: Every day | ORAL | 3 refills | Status: DC | PRN
Start: 2023-02-20 — End: 2023-10-10

## 2023-02-20 NOTE — Telephone Encounter (Signed)
Refilled patient medication for cialis 20mg . Pt stated he takes this every day, I explained that this is as needed on demand and not every day. Please advise

## 2023-02-20 NOTE — Telephone Encounter (Signed)
Patient called to request refill for Tadalafil 20 mg tablet. Pharmacy is [facility] [address]

## 2023-02-23 ENCOUNTER — Other Ambulatory Visit: Payer: Self-pay | Admitting: Urology

## 2023-02-23 DIAGNOSIS — E349 Endocrine disorder, unspecified: Secondary | ICD-10-CM

## 2023-06-06 ENCOUNTER — Other Ambulatory Visit: Payer: Self-pay | Admitting: *Deleted

## 2023-06-06 DIAGNOSIS — R972 Elevated prostate specific antigen [PSA]: Secondary | ICD-10-CM

## 2023-06-06 DIAGNOSIS — E291 Testicular hypofunction: Secondary | ICD-10-CM

## 2023-06-07 ENCOUNTER — Other Ambulatory Visit: Payer: Medicare Other

## 2023-06-07 DIAGNOSIS — E291 Testicular hypofunction: Secondary | ICD-10-CM

## 2023-06-07 DIAGNOSIS — R972 Elevated prostate specific antigen [PSA]: Secondary | ICD-10-CM

## 2023-06-08 LAB — TESTOSTERONE: Testosterone: 1172 ng/dL — ABNORMAL HIGH (ref 264–916)

## 2023-06-08 LAB — HEMOGLOBIN AND HEMATOCRIT, BLOOD
Hematocrit: 49.5 % (ref 37.5–51.0)
Hemoglobin: 16.1 g/dL (ref 13.0–17.7)

## 2023-06-08 LAB — PSA: Prostate Specific Ag, Serum: 0.8 ng/mL (ref 0.0–4.0)

## 2023-06-09 ENCOUNTER — Other Ambulatory Visit: Payer: BLUE CROSS/BLUE SHIELD

## 2023-06-09 NOTE — Progress Notes (Unsigned)
06/14/2023 8:35 AM   Jason Harrison Apr 11, 1951 [id number]  Referring provider: Barbette Reichmann, MD [address] [facility] [place],  [place] [address]  Urological history 1. Nephrolithiasis -Stone composition 100% calcium oxalate monohydrate -left URS 2021 -KUB (06/2021) - negative for stone  2. Testosterone deficiency -contributing factors of age, diabetes and obesity -testosterone (05/2023) - 1172 -Hemoglobin/hematocrit (06/2023) 16.1/49.5 -testosterone cypionate 200 mg/mL, 1 cc every 14 days   3. ED -contributing factors of age, diabetes, obesity and CAD -tadalafil 20 mg, on-demand-dosing  4. BPH with LU TS -PSA (06/2023) - 0.8  Chief Complaint  Patient presents with   Hypogonadism    HPI: Jason Harrison is a 72 y.o. male who presents today for one year follow up.   Previous records reviewed.   I PSS 9/3  He only experiences urinary frequency and urgency when he drinks diet dark cola.  Patient denies any modifying or aggravating factors.  Patient denies any recent UTI's, gross hematuria, dysuria or suprapubic/flank pain.  Patient denies any fevers, chills, nausea or vomiting.     IPSS     Row Name 06/14/23 0800         International Prostate Symptom Score   How often have you had the sensation of not emptying your bladder? Less than 1 in 5     How often have you had to urinate less than every two hours? Less than 1 in 5 times     How often have you found you stopped and started again several times when you urinated? Less than 1 in 5 times     How often have you found it difficult to postpone urination? Less than half the time     How often have you had a weak urinary stream? Less than 1 in 5 times     How often have you had to strain to start urination? Not at All     How many times did you typically get up at night to urinate? 3 Times     Total IPSS Score 9       Quality of Life due to urinary symptoms   If you were to spend the  rest of your life with your urinary condition just the way it is now how would you feel about that? Mixed                Score:  1-7 Mild 8-19 Moderate 20-35 Severe   SHIM 10  He is currently not in a relationship, so he is not as active as he had been in the past.  He had been using the tadalafil 20 mg on demand dosing, but he wished it was more consistent.  He rarely gets a spontaneous erection.  He does not have any curvature with erections.  He does not have any pain with erections.   SHIM     Row Name 06/14/23 0815         SHIM: Over the last 6 months:   How do you rate your confidence that you could get and keep an erection? Low     When you had erections with sexual stimulation, how often were your erections hard enough for penetration (entering your partner)? A Few Times (much less than half the time)     During sexual intercourse, how often were you able to maintain your erection after you had penetrated (entered) your partner? A Few Times (much less than half the time)  During sexual intercourse, how difficult was it to maintain your erection to completion of intercourse? Very Difficult     When you attempted sexual intercourse, how often was it satisfactory for you? A Few Times (much less than half the time)       SHIM Total Score   SHIM 10                Score: 1-7 Severe ED 8-11 Moderate ED 12-16 Mild-Moderate ED 17-21 Mild ED 22-25 No ED     PMH: Past Medical History:  Diagnosis Date   Actinic keratosis    Diabetes mellitus without complication (HCC)    type 2   Diabetic nephropathy (HCC)    Diabetic retinopathy (HCC)    Myocardial infarction (HCC) 2016   ONE STENT    Surgical History: Past Surgical History:  Procedure Laterality Date   CARDIAC CATHETERIZATION N/A 01/15/2015   Procedure: Left Heart Cath and Coronary Angiography;  Surgeon: Dalia Heading, MD;  Location: [facility] INVASIVE CV LAB;  Service: Cardiovascular;  Laterality: N/A;    CARDIAC CATHETERIZATION N/A 01/15/2015   Procedure: Coronary Stent Intervention;  Surgeon: Iran Ouch, MD;  Location: [facility] INVASIVE CV LAB;  Service: Cardiovascular;  Laterality: N/A;   COLONOSCOPY WITH PROPOFOL N/A 04/17/2018   Procedure: COLONOSCOPY WITH PROPOFOL;  Surgeon: Christena Deem, MD;  Location: [facility] ENDOSCOPY;  Service: Endoscopy;  Laterality: N/A;   CORONARY ANGIOPLASTY     CYSTECTOMY  1972   CYSTOSCOPY WITH STENT PLACEMENT Left 07/11/2019   Procedure: CYSTOSCOPY WITH STENT PLACEMENT;  Surgeon: Vanna Scotland, MD;  Location: [facility] ORS;  Service: Urology;  Laterality: Left;   CYSTOSCOPY/URETEROSCOPY/HOLMIUM LASER/STENT PLACEMENT Left 07/22/2019   Procedure: CYSTOSCOPY/URETEROSCOPY/HOLMIUM LASER/STENT Exchange;  Surgeon: Vanna Scotland, MD;  Location: [facility] ORS;  Service: Urology;  Laterality: Left;   EYE SURGERY      Home Medications:  Allergies as of 06/14/2023       Reactions   Trulicity [dulaglutide] Itching, Swelling        Medication List        Accurate as of June 14, 2023  8:35 AM. If you have any questions, ask your nurse or doctor.          Alpha-Lipoic Acid 600 MG Caps Take 600 mg by mouth daily.   aspirin EC 81 MG tablet Take 81 mg by mouth every evening.   atorvastatin 20 MG tablet Commonly known as: LIPITOR Take 20 mg by mouth every evening.   B-12 5000 MCG Caps Take 5,000 mcg by mouth daily.   BD Pen Needle Nano 2nd Gen 32G X 4 MM Misc Generic drug: Insulin Pen Needle USE 1 PEN NEEDLE ONCE DAILY   Biotin 16109 MCG Tabs Take 10,000 mcg by mouth daily.   brimonidine 0.2 % ophthalmic solution Commonly known as: ALPHAGAN Place 1 drop into the right eye 2 (two) times daily.   cetirizine 10 MG tablet Commonly known as: ZYRTEC Take 10 mg by mouth every morning.   CHROMIUM PICOLATE PO Take 5,000 mcg by mouth daily.   dorzolamide-timolol 2-0.5 % ophthalmic solution Commonly known as: COSOPT Place 1 drop into the right eye  2 (two) times daily.   Fish Oil 1000 MG Caps Take 1,000 mg by mouth daily.   fluorometholone 0.1 % ophthalmic suspension Commonly known as: FML 1 drop daily.   glimepiride 2 MG tablet Commonly known as: AMARYL TAKE 1 TABLET(2 MG) BY MOUTH DAILY WITH BREAKFAST   Jardiance 25 MG Tabs tablet Generic  drug: empagliflozin Take 25 mg by mouth daily.   latanoprost 0.005 % ophthalmic solution Commonly known as: XALATAN Place 1 drop into the right eye at bedtime.   LUBRICATING EYE DROPS OP Place 1 drop into both eyes 2 (two) times daily.   melatonin 5 MG Tabs Take 2.5 mg by mouth at bedtime as needed (sleep).   metFORMIN 1000 MG tablet Commonly known as: GLUCOPHAGE Take 1,000 mg by mouth 2 (two) times daily.   multivitamin with minerals Tabs tablet Take 1 tablet by mouth daily.   OVER THE COUNTER MEDICATION Take 1 capsule by mouth daily. Saffron supplement   Ozempic (0.25 or 0.5 MG/DOSE) 2 MG/1.5ML Sopn Generic drug: Semaglutide(0.25 or 0.5MG /DOS) Inject 0.5 mg into the skin once a week.   pioglitazone 15 MG tablet Commonly known as: ACTOS Take 15 mg by mouth daily.   PreserVision AREDS 2 Caps Take 1 capsule by mouth 2 (two) times daily.   Probiotic Caps Take 1 capsule by mouth daily.   tadalafil 20 MG tablet Commonly known as: CIALIS Take 1 tablet (20 mg total) by mouth daily as needed. What changed: Another medication with the same name was added. Make sure you understand how and when to take each. Changed by: Michiel Cowboy   tadalafil 5 MG tablet Commonly known as: CIALIS Take 1 tablet (5 mg total) by mouth daily as needed for erectile dysfunction. What changed: You were already taking a medication with the same name, and this prescription was added. Make sure you understand how and when to take each. Changed by: Michiel Cowboy   testosterone cypionate 200 MG/ML injection Commonly known as: DEPOTESTOSTERONE CYPIONATE ADMINISTER 1 ML(200 MG) IN THE MUSCLE  EVERY 14 DAYS   ticagrelor 90 MG Tabs tablet Commonly known as: BRILINTA Take 1 tablet (90 mg total) by mouth 2 (two) times daily.   TURMERIC PO Take 320 mg by mouth daily.   valsartan 160 MG tablet Commonly known as: DIOVAN Take 1 tablet (160 mg total) by mouth daily.   vitamin C 1000 MG tablet Take 1,000 mg by mouth daily.   Vitamin D 50 MCG (2000 UT) tablet Take 2,000 Units by mouth daily.   VITAMIN K PO Take 1 capsule by mouth daily.        Allergies:  Allergies  Allergen Reactions   Trulicity [Dulaglutide] Itching and Swelling    Family History: History reviewed. No pertinent family history.  Social History:  reports that he has never smoked. He has never used smokeless tobacco. He reports that he does not drink alcohol and does not use drugs.  ROS: Pertinent ROS in HPI  Physical Exam: BP (!) 156/90   Pulse 90   Ht 5\' 10"  (1.778 m)   Wt 200 lb (90.7 kg)   BMI 28.70 kg/m   Constitutional:  Well nourished. Alert and oriented, No acute distress. HEENT: [place] AT, moist mucus membranes.  Trachea midline Cardiovascular: No clubbing, cyanosis, or edema. Respiratory: Normal respiratory effort, no increased work of breathing. Neurologic: Grossly intact, no focal deficits, moving all 4 extremities. Psychiatric: Normal mood and affect.   Laboratory Data: CBC w/auto Differential (5 Part) Order: [id number] Component Ref Range & Units 5 mo ago  WBC (White Blood Cell Count) 4.1 - 10.2 10^3/uL 5.9  RBC (Red Blood Cell Count) 4.69 - 6.13 10^6/uL 5.00  Hemoglobin 14.1 - 18.1 gm/dL 78.2  Hematocrit 95.6 - 52.0 % 46.7  MCV (Mean Corpuscular Volume) 80.0 - 100.0 fl 93.4  MCH (Mean Corpuscular  Hemoglobin) 27.0 - 31.2 pg 30.4  MCHC (Mean Corpuscular Hemoglobin Concentration) 32.0 - 36.0 gm/dL 40.9  Platelet Count 811 - 450 10^3/uL 205  RDW-CV (Red Cell Distribution Width) 11.6 - 14.8 % 12.5  MPV (Mean Platelet Volume) 9.4 - 12.4 fl 9.3 Low   Neutrophils 1.50  - 7.80 10^3/uL 3.12  Lymphocytes 1.00 - 3.60 10^3/uL 1.81  Monocytes 0.00 - 1.50 10^3/uL 0.51  Eosinophils 0.00 - 0.55 10^3/uL 0.41  Basophils 0.00 - 0.09 10^3/uL 0.03  Neutrophil % 32.0 - 70.0 % 52.9  Lymphocyte % 10.0 - 50.0 % 30.7  Monocyte % 4.0 - 13.0 % 8.7  Eosinophil % 1.0 - 5.0 % 7.0 High   Basophil% 0.0 - 2.0 % 0.5  Immature Granulocyte % <=0.7 % 0.2  Immature Granulocyte Count <=0.06 10^3/L 0.01  Resulting Agency [facility]   Specimen Collected: 12/15/22 10:11   Performed by: [facility] Last Resulted: 12/15/22 10:54  Received From: [facility]  Result Received: 12/22/22 09:43    Comprehensive Metabolic Panel (CMP) Order: [id number] Component Ref Range & Units 5 mo ago  Glucose 70 - 110 mg/dL 213 High   Sodium 086 - 145 mmol/L 143  Potassium 3.6 - 5.1 mmol/L 4.9  Chloride 97 - 109 mmol/L 107  Carbon Dioxide (CO2) 22.0 - 32.0 mmol/L 25.3  Urea Nitrogen (BUN) 7 - 25 mg/dL 18  Creatinine 0.7 - 1.3 mg/dL 1.0  Glomerular Filtration Rate (eGFR) >60 mL/min/1.73sq m 80  Comment: CKD-EPI (2021) does not include patient's race in the calculation of eGFR.  Monitoring changes of plasma creatinine and eGFR over time is useful for monitoring kidney function.  Interpretive Ranges for eGFR (CKD-EPI 2021):  eGFR:       >60 mL/min/1.73 sq. m - Normal eGFR:       30-59 mL/min/1.73 sq. m - Moderately Decreased eGFR:       15-29 mL/min/1.73 sq. m  - Severely Decreased eGFR:       < 15 mL/min/1.73 sq. m  - Kidney Failure   Note: These eGFR calculations do not apply in acute situations when eGFR is changing rapidly or patients on dialysis.  Calcium 8.7 - 10.3 mg/dL 9.6  AST 8 - 39 U/L 17  ALT 6 - 57 U/L 17  Alk Phos (alkaline Phosphatase) 34 - 104 U/L 51  Albumin 3.5 - 4.8 g/dL 4.3  Bilirubin, Total 0.3 - 1.2 mg/dL 0.4  Protein, Total 6.1 - 7.9 g/dL 6.6  A/G Ratio 1.0 - 5.0 gm/dL 1.9  Resulting Agency  [facility]   Specimen Collected: 12/15/22 10:11   Performed by: [facility] Last Resulted: 12/15/22 14:40  Received From: [facility]  Result Received: 12/22/22 09:43    Hemoglobin A1C Order: [id number] Component Ref Range & Units 5 mo ago  Hemoglobin A1C 4.2 - 5.6 % 6.8 High   Average Blood Glucose (Calc) mg/dL 528  Resulting Agency [facility]  Narrative Performed by [facility] Normal Range:    4.2 - 5.6% Increased Risk:  5.7 - 6.4% Diabetes:        >= 6.5% Glycemic Control for adults with diabetes:  <7%    Specimen Collected: 12/15/22 10:11   Performed by: [facility] Last Resulted: 12/15/22 16:21  Received From: [facility]  Result Received: 12/22/22 09:43   Lipid Panel w/calc LDL Order: [id number] Component  Ref Range & Units 5 mo ago  Cholesterol, Total 100 - 200 mg/dL 604  Triglyceride 35 - 199 mg/dL 540  HDL (High Density Lipoprotein) Cholesterol 29.0 - 71.0 mg/dL 98.1  LDL Calculated 0 - 130 mg/dL 38  VLDL Cholesterol mg/dL 23  Cholesterol/HDL Ratio 2.4  Resulting Agency [facility]   Specimen Collected: 12/15/22 10:11   Performed by: [facility] Last Resulted: 12/15/22 15:07  Received From: [facility]  Result Received: 12/22/22 09:43   Thyroid Stimulating-Hormone (TSH) Order: [id number] Component Ref Range & Units 5 mo ago  Thyroid Stimulating Hormone (TSH) 0.450-5.330 uIU/ml uIU/mL 2.725  Resulting Agency [facility]   Specimen Collected: 12/15/22 10:11   Performed by: [facility] Last Resulted: 12/15/22 14:41  Received From: [facility]  Result Received: 12/22/22 09:43   Urinalysis w/Microscopic Order: [id number] Component Ref Range & Units 5 mo ago  Color Colorless, Straw, Light Yellow, Yellow, Dark Yellow Yellow  Clarity Clear Clear   Specific Gravity 1.005 - 1.030 >1.030 High   pH, Urine 5.0 - 8.0 5.5  Protein, Urinalysis Negative mg/dL Negative  Glucose, Urinalysis Negative mg/dL 4+ Abnormal   Ketones, Urinalysis Negative mg/dL Negative  Blood, Urinalysis Negative Negative  Nitrite, Urinalysis Negative Negative  Leukocyte Esterase, Urinalysis Negative Negative  Bilirubin, Urinalysis Negative Negative  Urobilinogen, Urinalysis 0.2 - 1.0 mg/dL 0.2  WBC, UA <=5 /hpf <1  Red Blood Cells, Urinalysis <=3 /hpf <1  Bacteria, Urinalysis 0 - 5 /hpf 0-5  Squamous Epithelial Cells, Urinalysis /hpf 0  Resulting Agency [facility]   Specimen Collected: 12/15/22 10:11   Performed by: [facility] Last Resulted: 12/15/22 11:12  Received From: [facility]  Result Received: 12/22/22 09:43  I have reviewed the labs.   Pertinent Imaging: N/A  Assessment & Plan:    1. Testosterone deficiency -testosterone levels are therapeutic -Hemoglobin/hematocrits normal -continue testosterone cypionate 200 mg/mL, 1 cc every 14 days    2. BPH with LUTS -PSA stable -continue conservative management, avoiding bladder irritants and timed voiding's   3. ED -continue tadalafil 20 mg daily -Since he is experiencing some urgency and frequency with consumption of diet sodas and he is not completely at goal with his erectile dysfunction, we will have him take tadalafil 5 mg daily and augment with the 20 mg tadalafil when he wants to pursue sexual activity  Return in about 6 months (around 12/13/2023) for PSA, testosterone (one week after injection) H & H - only .  These notes generated with voice recognition software. I apologize for typographical errors.  Cloretta Ned  [facility] [address]  [address] [place], [place] [address] [phone]

## 2023-06-14 ENCOUNTER — Ambulatory Visit (INDEPENDENT_AMBULATORY_CARE_PROVIDER_SITE_OTHER): Payer: Medicare Other | Admitting: Urology

## 2023-06-14 ENCOUNTER — Encounter: Payer: Self-pay | Admitting: Urology

## 2023-06-14 VITALS — BP 156/90 | HR 90 | Ht 70.0 in | Wt 200.0 lb

## 2023-06-14 DIAGNOSIS — N401 Enlarged prostate with lower urinary tract symptoms: Secondary | ICD-10-CM

## 2023-06-14 DIAGNOSIS — N529 Male erectile dysfunction, unspecified: Secondary | ICD-10-CM | POA: Diagnosis not present

## 2023-06-14 DIAGNOSIS — N400 Enlarged prostate without lower urinary tract symptoms: Secondary | ICD-10-CM

## 2023-06-14 DIAGNOSIS — E349 Endocrine disorder, unspecified: Secondary | ICD-10-CM

## 2023-06-14 DIAGNOSIS — E291 Testicular hypofunction: Secondary | ICD-10-CM

## 2023-06-14 MED ORDER — TESTOSTERONE CYPIONATE 200 MG/ML IM SOLN: INTRAMUSCULAR | 0 refills | Status: AC

## 2023-06-14 MED ORDER — TADALAFIL 5 MG PO TABS: 5.0000 mg | ORAL_TABLET | Freq: Every day | ORAL | 3 refills | Status: DC | PRN

## 2023-09-18 ENCOUNTER — Ambulatory Visit: Admitting: Dermatology

## 2023-09-18 ENCOUNTER — Encounter: Payer: Self-pay | Admitting: Dermatology

## 2023-09-18 DIAGNOSIS — W908XXA Exposure to other nonionizing radiation, initial encounter: Secondary | ICD-10-CM

## 2023-09-18 DIAGNOSIS — D1801 Hemangioma of skin and subcutaneous tissue: Secondary | ICD-10-CM

## 2023-09-18 DIAGNOSIS — L820 Inflamed seborrheic keratosis: Secondary | ICD-10-CM

## 2023-09-18 DIAGNOSIS — L578 Other skin changes due to chronic exposure to nonionizing radiation: Secondary | ICD-10-CM

## 2023-09-18 DIAGNOSIS — D229 Melanocytic nevi, unspecified: Secondary | ICD-10-CM

## 2023-09-18 DIAGNOSIS — L859 Epidermal thickening, unspecified: Secondary | ICD-10-CM | POA: Diagnosis not present

## 2023-09-18 DIAGNOSIS — L814 Other melanin hyperpigmentation: Secondary | ICD-10-CM

## 2023-09-18 DIAGNOSIS — D239 Other benign neoplasm of skin, unspecified: Secondary | ICD-10-CM

## 2023-09-18 DIAGNOSIS — Z872 Personal history of diseases of the skin and subcutaneous tissue: Secondary | ICD-10-CM

## 2023-09-18 DIAGNOSIS — D2271 Melanocytic nevi of right lower limb, including hip: Secondary | ICD-10-CM

## 2023-09-18 DIAGNOSIS — Z1283 Encounter for screening for malignant neoplasm of skin: Secondary | ICD-10-CM

## 2023-09-18 DIAGNOSIS — D489 Neoplasm of uncertain behavior, unspecified: Secondary | ICD-10-CM

## 2023-09-18 DIAGNOSIS — D492 Neoplasm of unspecified behavior of bone, soft tissue, and skin: Secondary | ICD-10-CM | POA: Diagnosis not present

## 2023-09-18 DIAGNOSIS — L570 Actinic keratosis: Secondary | ICD-10-CM

## 2023-09-18 DIAGNOSIS — L821 Other seborrheic keratosis: Secondary | ICD-10-CM

## 2023-09-18 HISTORY — DX: Other benign neoplasm of skin, unspecified: D23.9

## 2023-09-18 NOTE — Patient Instructions (Addendum)

## 2023-09-18 NOTE — Progress Notes (Signed)
 Follow-Up Visit   Subjective  Jason Harrison is a 73 y.o. male who presents for the following: Skin Cancer Screening and Full Body Skin Exam Hx of aks, hx of mole at right calf. Reports some crusted spots at left ear that will not go away also a crusted spot at right clavicle and itchy spots on back.  The patient presents for Total-Body Skin Exam (TBSE) for skin cancer screening and mole check. The patient has spots, moles and lesions to be evaluated, some may be new or changing and the patient may have concern these could be cancer.  The following portions of the chart were reviewed this encounter and updated as appropriate: medications, allergies, medical history  Review of Systems:  No other skin or systemic complaints except as noted in HPI or Assessment and Plan.  Objective  Well appearing patient in no apparent distress; mood and affect are within normal limits.  A full examination was performed including scalp, head, eyes, ears, nose, lips, neck, chest, axillae, abdomen, back, buttocks, bilateral upper extremities, bilateral lower extremities, hands, feet, fingers, toes, fingernails, and toenails. All findings within normal limits unless otherwise noted below.   Relevant physical exam findings are noted in the Assessment and Plan.  back x 6, right clavicle x 1, left bicep x 1 (8) Erythematous stuck-on, waxy papule or plaque Dorsum of Nose x 1 Erythematous thin papules/macules with gritty scale.  left ear helix 1.1 cm  crusted papule see photo  right calf 0.4 cm irregular brown macule   Assessment & Plan   SKIN CANCER SCREENING PERFORMED TODAY.  ACTINIC DAMAGE - Chronic condition, secondary to cumulative UV/sun exposure - diffuse scaly erythematous macules with underlying dyspigmentation - Recommend daily broad spectrum sunscreen SPF 30+ to sun-exposed areas, reapply every 2 hours as needed.  - Staying in the shade or wearing long sleeves, sun glasses (UVA+UVB  protection) and wide brim hats (4-inch brim around the entire circumference of the hat) are also recommended for sun protection.  - Call for new or changing lesions.  LENTIGINES, SEBORRHEIC KERATOSES, HEMANGIOMAS - Benign normal skin lesions - Benign-appearing - Call for any changes  MELANOCYTIC NEVI - Tan-brown and/or pink-flesh-colored symmetric macules and papules - Benign appearing on exam today - Observation - Call clinic for new or changing moles - Recommend daily use of broad spectrum spf 30+ sunscreen to sun-exposed areas.   NEVUS  Right calf  Exam: 0.4 cm regular brown macule  Treatment Plan: Benign appearing on exam today. Recommend observation. Call clinic for new or changing moles. Recommend daily use of broad spectrum spf 30+ sunscreen to sun-exposed areas.   INFLAMED SEBORRHEIC KERATOSIS (8) back x 6, right clavicle x 1, left bicep x 1 (8) Symptomatic, irritating, patient would like treated. Destruction of lesion - back x 6, right clavicle x 1, left bicep x 1 (8) Complexity: simple   Destruction method: cryotherapy   Informed consent: discussed and consent obtained   Timeout:  patient name, date of birth, surgical site, and procedure verified Lesion destroyed using liquid nitrogen: Yes   Region frozen until ice ball extended beyond lesion: Yes   Outcome: patient tolerated procedure well with no complications   Post-procedure details: wound care instructions given   ACTINIC KERATOSIS Dorsum of Nose x 1 Actinic keratoses are precancerous spots that appear secondary to cumulative UV radiation exposure/sun exposure over time. They are chronic with expected duration over 1 year. A portion of actinic keratoses will progress to squamous cell carcinoma of  the skin. It is not possible to reliably predict which spots will progress to skin cancer and so treatment is recommended to prevent development of skin cancer.  Recommend daily broad spectrum sunscreen SPF 30+ to  sun-exposed areas, reapply every 2 hours as needed.  Recommend staying in the shade or wearing long sleeves, sun glasses (UVA+UVB protection) and wide brim hats (4-inch brim around the entire circumference of the hat). Call for new or changing lesions. Destruction of lesion - Dorsum of Nose x 1 Complexity: simple   Destruction method: cryotherapy   Informed consent: discussed and consent obtained   Timeout:  patient name, date of birth, surgical site, and procedure verified Lesion destroyed using liquid nitrogen: Yes   Region frozen until ice ball extended beyond lesion: Yes   Outcome: patient tolerated procedure well with no complications   Post-procedure details: wound care instructions given   NEOPLASM OF UNCERTAIN BEHAVIOR (2) left ear helix Skin excision  Total excision diameter (cm):  1.1 Informed consent: discussed and consent obtained   Timeout: patient name, date of birth, surgical site, and procedure verified   Procedure prep:  Patient was prepped and draped in usual sterile fashion Prep type:  Isopropyl alcohol and povidone-iodine Anesthesia: the lesion was anesthetized in a standard fashion   Anesthetic:  1% lidocaine w/ epinephrine 1-100,000 buffered w/ 8.4% NaHCO3 Instrument used: #15 blade   Hemostasis achieved with: pressure   Hemostasis achieved with comment:  Electrocautery Outcome: patient tolerated procedure well with no complications   Post-procedure details: sterile dressing applied and wound care instructions given   Dressing type: bandage and pressure dressing (Mupirocin)   Specimen 1 - Surgical pathology Differential Diagnosis: R/o Chondrodermatitis nodularis  chronica helicis vs cancer   Check Margins: No right calf Epidermal / dermal shaving  Lesion diameter (cm):  0.4 Informed consent: discussed and consent obtained   Timeout: patient name, date of birth, surgical site, and procedure verified   Procedure prep:  Patient was prepped and draped in usual  sterile fashion Prep type:  Isopropyl alcohol Anesthesia: the lesion was anesthetized in a standard fashion   Anesthetic:  1% lidocaine w/ epinephrine 1-100,000 buffered w/ 8.4% NaHCO3 Instrument used: flexible razor blade   Hemostasis achieved with: pressure, aluminum chloride and electrodesiccation   Outcome: patient tolerated procedure well   Post-procedure details: sterile dressing applied and wound care instructions given   Dressing type: bandage and petrolatum   Specimen 2 - Surgical pathology Differential Diagnosis: Irregular nevus r/o dysplasia   Check Margins: No R/o Chondrodermatitis nodularis  chronica helicis vs cancer   Irregular nevus r/o dysplasia  SKIN CANCER SCREENING   ACTINIC SKIN DAMAGE   LENTIGO   MELANOCYTIC NEVUS, UNSPECIFIED LOCATION   SEBORRHEIC KERATOSIS   Return in about 1 year (around 09/17/2024) for TBSE.   Documentation: I have reviewed the above documentation for accuracy and completeness, and I agree with the above.  Armida Sans, MD

## 2023-09-21 LAB — SURGICAL PATHOLOGY

## 2023-09-25 ENCOUNTER — Encounter: Payer: Self-pay | Admitting: Dermatology

## 2023-09-25 ENCOUNTER — Telehealth: Payer: Self-pay

## 2023-09-25 NOTE — Telephone Encounter (Addendum)
 Called and discussed bx results and recommendations with patient. He verbalized understanding. Patient scheduled for 6 month follow up to recheck dysplastic nevus at right calf. Advised if noticed any changes before that appointment to call and we could check sooner. Patient denied further questions.  ----- Message from Armida Sans sent at 09/25/2023 12:17 PM EDT ----- FINAL DIAGNOSIS        1. Skin, left ear helix :       EPIDERMAL HYPERPLASIA WITH FIBROSIS, SEE DESCRIPTION        2. Skin, right calf :       DYSPLASTIC JUNCTIONAL NEVUS WITH SEVERE ATYPIA, CLOSE TO MARGIN, SEE DESCRIPTION   1- benign thickening of skin of ear May be consistent with Chondrodermatitis Chondrodermatitis Nodularis Chronica Helicis (CNCH or CNH) is a common, benign inflammatory condition of the ear cartilage and overlying skin associated with very sensitive tender papule(s).  Trauma or pressure from sleeping on the ear or from cell phone use and sun damage may be exacerbating factors.  Treatment may include using a C-shaped airplane neck pillow for sleeping on the side of the head so no pressure is on the ear.  Other treatments include topical or intralesional steroids; liquid nitrogen or laser destruction; shave removal or excision.  The condition can be difficult to treat and persist or recur despite treatment.  2- Severe dysplastic Margins clear, but "close to margin" May need additional procedure Recheck in 6 mos - MAKE APPT FOR PT IN 6 MOS.

## 2023-10-06 ENCOUNTER — Other Ambulatory Visit: Payer: Self-pay | Admitting: Neurosurgery

## 2023-10-10 NOTE — Progress Notes (Signed)
 Surgical Instructions   Your procedure is scheduled on October 23, 2023. Report to [facility] Main Entrance "A" at 8:30 A.M., then check in with the Admitting office. Any questions or running late day of surgery: call [phone]  Questions prior to your surgery date: call [phone], Monday-Friday, 8am-4pm. If you experience any cold or flu symptoms such as cough, fever, chills, shortness of breath, etc. between now and your scheduled surgery, please notify us at the above number.     Remember:  Do not eat  or drink after midnight the night before your surgery     Take these medicines the morning of surgery with A SIP OF WATER  brimonidine (ALPHAGAN) ophthalmic solution  cetirizine (ZYRTEC)  COSOPT  May take these medicines IF NEEDED: LUBRICATING EYE DROPS OP   aspirin FOLLOW INSTRUCTIONS GIVEN TO  YOU BY YOUR SURGEON.  One week prior to surgery, STOP taking any Aspirin (unless otherwise instructed by your surgeon) Aleve, Naproxen, Ibuprofen, Motrin, Advil, Goody's, BC's, all herbal medications, fish oil, and non-prescription vitamins.       WHAT DO I DO ABOUT MY DIABETES MEDICATION?   Do not take oral diabetes medicines  glimepiride (AMARYL)  metFORMIN (GLUCOPHAGE), pioglitazone (ACTOS) JARDIANCE LAST DOSE: October 19, 2023 the morning of surgery.  OZEMPIC LAST DOSE:       The day of surgery, do not take other diabetes injectables, including Byetta (exenatide), Bydureon (exenatide ER), Victoza (liraglutide), or Trulicity (dulaglutide).  If your CBG is greater than 220 mg/dL, you may take  of your sliding scale (correction) dose of insulin.   HOW TO MANAGE YOUR DIABETES BEFORE AND AFTER SURGERY  Why is it important to control my blood sugar before and after surgery? Improving blood sugar levels before and after surgery helps healing and can limit problems. A way of improving blood sugar control is eating a healthy diet by:  Eating less sugar and carbohydrates   Increasing activity/exercise  Talking with your doctor about reaching your blood sugar goals High blood sugars (greater than 180 mg/dL) can raise your risk of infections and slow your recovery, so you will need to focus on controlling your diabetes during the weeks before surgery. Make sure that the doctor who takes care of your diabetes knows about your planned surgery including the date and location.  How do I manage my blood sugar before surgery? Check your blood sugar at least 4 times a day, starting 2 days before surgery, to make sure that the level is not too high or low.  Check your blood sugar the morning of your surgery when you wake up and every 2 hours until you get to the Short Stay unit.  If your blood sugar is less than 70 mg/dL, you will need to treat for low blood sugar: Do not take insulin. Treat a low blood sugar (less than 70 mg/dL) with  cup of clear juice (cranberry or apple), 4 glucose tablets, OR glucose gel. Recheck blood sugar in 15 minutes after treatment (to make sure it is greater than 70 mg/dL). If your blood sugar is not greater than 70 mg/dL on recheck, call [phone] for further instructions. Report your blood sugar to the short stay nurse when you get to Short Stay.  If you are admitted to the hospital after surgery: Your blood sugar will be checked by the staff and you will probably be given insulin after surgery (instead of oral diabetes medicines) to make sure you have good blood sugar levels. The goal  for blood sugar control after surgery is 80-180 mg/dL.                Do NOT Smoke (Tobacco/Vaping) for 24 hours prior to your procedure.  If you use a CPAP at night, you may bring your mask/headgear for your overnight stay.   You will be asked to remove any contacts, glasses, piercing's, hearing aid's, dentures/partials prior to surgery. Please bring cases for these items if needed.    Patients discharged the day of surgery will not be allowed to drive  home, and someone needs to stay with them for 24 hours.  SURGICAL WAITING ROOM VISITATION Patients may have no more than 2 support people in the waiting area - these visitors may rotate.   Pre-op nurse will coordinate an appropriate time for 1 ADULT support person, who may not rotate, to accompany patient in pre-op.  Children under the age of 29 must have an adult with them who is not the patient and must remain in the main waiting area with an adult.  If the patient needs to stay at the hospital during part of their recovery, the visitor guidelines for inpatient rooms apply.  Please refer to the [facility] website for the visitor guidelines for any additional information.   If you received a COVID test during your pre-op visit  it is requested that you wear a mask when out in public, stay away from anyone that may not be feeling well and notify your surgeon if you develop symptoms. If you have been in contact with anyone that has tested positive in the last 10 days please notify you surgeon.      Pre-operative 5 CHG Bathing Instructions   You can play a key role in reducing the risk of infection after surgery. Your skin needs to be as free of germs as possible. You can reduce the number of germs on your skin by washing with CHG (chlorhexidine gluconate) soap before surgery. CHG is an antiseptic soap that kills germs and continues to kill germs even after washing.   DO NOT use if you have an allergy to chlorhexidine/CHG or antibacterial soaps. If your skin becomes reddened or irritated, stop using the CHG and notify one of our RNs at [phone].   Please shower with the CHG soap starting 4 days before surgery using the following schedule:     Please keep in mind the following:  DO NOT shave, including legs and underarms, starting the day of your first shower.   You may shave your face at any point before/day of surgery.  Place clean sheets on your bed the day you start using CHG  soap. Use a clean washcloth (not used since being washed) for each shower. DO NOT sleep with pets once you start using the CHG.   CHG Shower Instructions:  Wash your face and private area with normal soap. If you choose to wash your hair, wash first with your normal shampoo.  After you use shampoo/soap, rinse your hair and body thoroughly to remove shampoo/soap residue.  Turn the water OFF and apply about 3 tablespoons (45 ml) of CHG soap to a CLEAN washcloth.  Apply CHG soap ONLY FROM YOUR NECK DOWN TO YOUR TOES (washing for 3-5 minutes)  DO NOT use CHG soap on face, private areas, open wounds, or sores.  Pay special attention to the area where your surgery is being performed.  If you are having back surgery, having someone wash your back for you  may be helpful. Wait 2 minutes after CHG soap is applied, then you may rinse off the CHG soap.  Pat dry with a clean towel  Put on clean clothes/pajamas   If you choose to wear lotion, please use ONLY the CHG-compatible lotions that are listed below.  Additional instructions for the day of surgery: DO NOT APPLY any lotions, deodorants, cologne, or perfumes.   Do not bring valuables to the hospital. [facility] is not responsible for any belongings/valuables. Do not wear nail polish, gel polish, artificial nails, or any other type of covering on natural nails (fingers and toes) Do not wear jewelry or makeup Put on clean/comfortable clothes.  Please brush your teeth.  Ask your nurse before applying any prescription medications to the skin.     CHG Compatible Lotions   Aveeno Moisturizing lotion  Cetaphil Moisturizing Cream  Cetaphil Moisturizing Lotion  Clairol Herbal Essence Moisturizing Lotion, Dry Skin  Clairol Herbal Essence Moisturizing Lotion, Extra Dry Skin  Clairol Herbal Essence Moisturizing Lotion, Normal Skin  Curel Age Defying Therapeutic Moisturizing Lotion with Alpha Hydroxy  Curel Extreme Care Body Lotion  Curel Soothing  Hands Moisturizing Hand Lotion  Curel Therapeutic Moisturizing Cream, Fragrance-Free  Curel Therapeutic Moisturizing Lotion, Fragrance-Free  Curel Therapeutic Moisturizing Lotion, Original Formula  Eucerin Daily Replenishing Lotion  Eucerin Dry Skin Therapy Plus Alpha Hydroxy Crme  Eucerin Dry Skin Therapy Plus Alpha Hydroxy Lotion  Eucerin Original Crme  Eucerin Original Lotion  Eucerin Plus Crme Eucerin Plus Lotion  Eucerin TriLipid Replenishing Lotion  Keri Anti-Bacterial Hand Lotion  Keri Deep Conditioning Original Lotion Dry Skin Formula Softly Scented  Keri Deep Conditioning Original Lotion, Fragrance Free Sensitive Skin Formula  Keri Lotion Fast Absorbing Fragrance Free Sensitive Skin Formula  Keri Lotion Fast Absorbing Softly Scented Dry Skin Formula  Keri Original Lotion  Keri Skin Renewal Lotion Keri Silky Smooth Lotion  Keri Silky Smooth Sensitive Skin Lotion  Nivea Body Creamy Conditioning Oil  Nivea Body Extra Enriched Lotion  Nivea Body Original Lotion  Nivea Body Sheer Moisturizing Lotion Nivea Crme  Nivea Skin Firming Lotion  NutraDerm 30 Skin Lotion  NutraDerm Skin Lotion  NutraDerm Therapeutic Skin Cream  NutraDerm Therapeutic Skin Lotion  ProShield Protective Hand Cream  Provon moisturizing lotion  Please read over the following fact sheets that you were given.

## 2023-10-11 ENCOUNTER — Other Ambulatory Visit: Payer: Self-pay

## 2023-10-11 ENCOUNTER — Encounter (HOSPITAL_COMMUNITY)
Admission: RE | Admit: 2023-10-11 | Discharge: 2023-10-11 | Disposition: A | Discharge status: Home / Self Care | Source: Ambulatory Visit | Attending: Neurosurgery | Admitting: Neurosurgery

## 2023-10-11 ENCOUNTER — Encounter (HOSPITAL_COMMUNITY): Payer: Self-pay

## 2023-10-11 VITALS — BP 179/97 | HR 101 | Temp 97.7°F | Resp 18 | Ht 70.0 in | Wt 218.0 lb

## 2023-10-11 DIAGNOSIS — E088 Diabetes mellitus due to underlying condition with unspecified complications: Secondary | ICD-10-CM

## 2023-10-11 DIAGNOSIS — Z7984 Long term (current) use of oral hypoglycemic drugs: Secondary | ICD-10-CM | POA: Insufficient documentation

## 2023-10-11 DIAGNOSIS — M48062 Spinal stenosis, lumbar region with neurogenic claudication: Secondary | ICD-10-CM | POA: Diagnosis not present

## 2023-10-11 DIAGNOSIS — E1121 Type 2 diabetes mellitus with diabetic nephropathy: Secondary | ICD-10-CM | POA: Diagnosis not present

## 2023-10-11 DIAGNOSIS — I255 Ischemic cardiomyopathy: Secondary | ICD-10-CM | POA: Diagnosis not present

## 2023-10-11 DIAGNOSIS — I4510 Unspecified right bundle-branch block: Secondary | ICD-10-CM | POA: Diagnosis not present

## 2023-10-11 DIAGNOSIS — I509 Heart failure, unspecified: Secondary | ICD-10-CM | POA: Insufficient documentation

## 2023-10-11 DIAGNOSIS — E1140 Type 2 diabetes mellitus with diabetic neuropathy, unspecified: Secondary | ICD-10-CM | POA: Diagnosis not present

## 2023-10-11 DIAGNOSIS — Z01818 Encounter for other preprocedural examination: Secondary | ICD-10-CM

## 2023-10-11 DIAGNOSIS — I252 Old myocardial infarction: Secondary | ICD-10-CM | POA: Diagnosis not present

## 2023-10-11 DIAGNOSIS — I2510 Atherosclerotic heart disease of native coronary artery without angina pectoris: Secondary | ICD-10-CM | POA: Insufficient documentation

## 2023-10-11 DIAGNOSIS — Z01812 Encounter for preprocedural laboratory examination: Secondary | ICD-10-CM | POA: Diagnosis present

## 2023-10-11 DIAGNOSIS — Z955 Presence of coronary angioplasty implant and graft: Secondary | ICD-10-CM | POA: Insufficient documentation

## 2023-10-11 HISTORY — DX: Personal history of urinary calculi: Z87.442

## 2023-10-11 LAB — CBC
HCT: 45.3 % (ref 39.0–52.0)
Hemoglobin: 14.8 g/dL (ref 13.0–17.0)
MCH: 29.7 pg (ref 26.0–34.0)
MCHC: 32.7 g/dL (ref 30.0–36.0)
MCV: 90.8 fL (ref 80.0–100.0)
Platelets: 225 10*3/uL (ref 150–400)
RBC: 4.99 MIL/uL (ref 4.22–5.81)
RDW: 13.1 % (ref 11.5–15.5)
WBC: 5 10*3/uL (ref 4.0–10.5)
nRBC: 0 % (ref 0.0–0.2)

## 2023-10-11 LAB — BASIC METABOLIC PANEL WITH GFR
Anion gap: 10 (ref 5–15)
BUN: 15 mg/dL (ref 8–23)
CO2: 25 mmol/L (ref 22–32)
Calcium: 9.3 mg/dL (ref 8.9–10.3)
Chloride: 103 mmol/L (ref 98–111)
Creatinine, Ser: 1.17 mg/dL (ref 0.61–1.24)
GFR, Estimated: >60 mL/min (ref >60)
Glucose, Bld: 222 mg/dL — ABNORMAL HIGH (ref 70–99)
Potassium: 4.5 mmol/L (ref 3.5–5.1)
Sodium: 138 mmol/L (ref 135–145)

## 2023-10-11 LAB — HEMOGLOBIN A1C
Hgb A1c MFr Bld: 7.8 % — ABNORMAL HIGH (ref 4.8–5.6)
Mean Plasma Glucose: 177.16 mg/dL

## 2023-10-11 LAB — TYPE AND SCREEN
ABO/RH(D): O NEG
Antibody Screen: NEGATIVE

## 2023-10-11 LAB — GLUCOSE, CAPILLARY: Glucose-Capillary: 247 mg/dL — ABNORMAL HIGH (ref 70–99)

## 2023-10-11 NOTE — Progress Notes (Signed)
 Surgical Instructions   Your procedure is scheduled on October 23, 2023. Report to [facility] Main Entrance "A" at 8:30 A.M., then check in with the Admitting office. Any questions or running late day of surgery: call [phone]  Questions prior to your surgery date: call [phone], Monday-Friday, 8am-4pm. If you experience any cold or flu symptoms such as cough, fever, chills, shortness of breath, etc. between now and your scheduled surgery, please notify us at the above number.     Remember:  Do not eat  or drink after midnight the night before your surgery     Take these medicines the morning of surgery with A SIP OF WATER  brimonidine (ALPHAGAN) ophthalmic solution  cetirizine (ZYRTEC)  COSOPT  May take these medicines IF NEEDED: LUBRICATING EYE DROPS OP   aspirin FOLLOW INSTRUCTIONS GIVEN TO  YOU BY YOUR SURGEON.  One week prior to surgery, STOP taking any Aleve, Naproxen, Ibuprofen, Motrin, Advil, Goody's, BC's, all herbal medications, fish oil, and non-prescription vitamins.       WHAT DO I DO ABOUT MY DIABETES MEDICATION?   Do not take oral diabetes medicines  glimepiride (AMARYL)  metFORMIN (GLUCOPHAGE), pioglitazone (ACTOS the morning of surgery.  Hold Jardiance 72 hours prior to surgery. Last dose will be 10/19/23.            Hold Ozempic according to the instructions given to you by Dr. Wynetta Emery. Your          Last dose was 4/6.   HOW TO MANAGE YOUR DIABETES BEFORE AND AFTER SURGERY  Why is it important to control my blood sugar before and after surgery? Improving blood sugar levels before and after surgery helps healing and can limit problems. A way of improving blood sugar control is eating a healthy diet by:  Eating less sugar and carbohydrates  Increasing activity/exercise  Talking with your doctor about reaching your blood sugar goals High blood sugars (greater than 180 mg/dL) can raise your risk of infections and slow your recovery, so you will need to  focus on controlling your diabetes during the weeks before surgery. Make sure that the doctor who takes care of your diabetes knows about your planned surgery including the date and location.  How do I manage my blood sugar before surgery? Check your blood sugar at least 4 times a day, starting 2 days before surgery, to make sure that the level is not too high or low.  Check your blood sugar the morning of your surgery when you wake up and every 2 hours until you get to the Short Stay unit.  If your blood sugar is less than 70 mg/dL, you will need to treat for low blood sugar: Do not take insulin. Treat a low blood sugar (less than 70 mg/dL) with  cup of clear juice (cranberry or apple), 4 glucose tablets, OR glucose gel. Recheck blood sugar in 15 minutes after treatment (to make sure it is greater than 70 mg/dL). If your blood sugar is not greater than 70 mg/dL on recheck, call [phone] for further instructions. Report your blood sugar to the short stay nurse when you get to Short Stay.  If you are admitted to the hospital after surgery: Your blood sugar will be checked by the staff and you will probably be given insulin after surgery (instead of oral diabetes medicines) to make sure you have good blood sugar levels. The goal for blood sugar control after surgery is 80-180 mg/dL.  Do NOT Smoke (Tobacco/Vaping) for 24 hours prior to your procedure.  If you use a CPAP at night, you may bring your mask/headgear for your overnight stay.   You will be asked to remove any contacts, glasses, piercing's, hearing aid's, dentures/partials prior to surgery. Please bring cases for these items if needed.    Patients discharged the day of surgery will not be allowed to drive home, and someone needs to stay with them for 24 hours.  SURGICAL WAITING ROOM VISITATION Patients may have no more than 2 support people in the waiting area - these visitors may rotate.   Pre-op nurse will  coordinate an appropriate time for 1 ADULT support person, who may not rotate, to accompany patient in pre-op.  Children under the age of 56 must have an adult with them who is not the patient and must remain in the main waiting area with an adult.  If the patient needs to stay at the hospital during part of their recovery, the visitor guidelines for inpatient rooms apply.  Please refer to the [facility] website for the visitor guidelines for any additional information.   If you received a COVID test during your pre-op visit  it is requested that you wear a mask when out in public, stay away from anyone that may not be feeling well and notify your surgeon if you develop symptoms. If you have been in contact with anyone that has tested positive in the last 10 days please notify you surgeon.      Pre-operative 5 CHG Bathing Instructions   You can play a key role in reducing the risk of infection after surgery. Your skin needs to be as free of germs as possible. You can reduce the number of germs on your skin by washing with CHG (chlorhexidine gluconate) soap before surgery. CHG is an antiseptic soap that kills germs and continues to kill germs even after washing.   DO NOT use if you have an allergy to chlorhexidine/CHG or antibacterial soaps. If your skin becomes reddened or irritated, stop using the CHG and notify one of our RNs at [phone].   Please shower with the CHG soap starting 4 days before surgery using the following schedule:     Please keep in mind the following:  DO NOT shave, including legs and underarms, starting the day of your first shower.   You may shave your face at any point before/day of surgery.  Place clean sheets on your bed the day you start using CHG soap. Use a clean washcloth (not used since being washed) for each shower. DO NOT sleep with pets once you start using the CHG.   CHG Shower Instructions:  Wash your face and private area with normal soap. If  you choose to wash your hair, wash first with your normal shampoo.  After you use shampoo/soap, rinse your hair and body thoroughly to remove shampoo/soap residue.  Turn the water OFF and apply about 3 tablespoons (45 ml) of CHG soap to a CLEAN washcloth.  Apply CHG soap ONLY FROM YOUR NECK DOWN TO YOUR TOES (washing for 3-5 minutes)  DO NOT use CHG soap on face, private areas, open wounds, or sores.  Pay special attention to the area where your surgery is being performed.  If you are having back surgery, having someone wash your back for you may be helpful. Wait 2 minutes after CHG soap is applied, then you may rinse off the CHG soap.  Pat dry with a  clean towel  Put on clean clothes/pajamas   If you choose to wear lotion, please use ONLY the CHG-compatible lotions that are listed below.  Additional instructions for the day of surgery: DO NOT APPLY any lotions, deodorants, cologne, or perfumes.   Do not bring valuables to the hospital. [facility] is not responsible for any belongings/valuables. Do not wear nail polish, gel polish, artificial nails, or any other type of covering on natural nails (fingers and toes) Do not wear jewelry or makeup Put on clean/comfortable clothes.  Please brush your teeth.  Ask your nurse before applying any prescription medications to the skin.     CHG Compatible Lotions   Aveeno Moisturizing lotion  Cetaphil Moisturizing Cream  Cetaphil Moisturizing Lotion  Clairol Herbal Essence Moisturizing Lotion, Dry Skin  Clairol Herbal Essence Moisturizing Lotion, Extra Dry Skin  Clairol Herbal Essence Moisturizing Lotion, Normal Skin  Curel Age Defying Therapeutic Moisturizing Lotion with Alpha Hydroxy  Curel Extreme Care Body Lotion  Curel Soothing Hands Moisturizing Hand Lotion  Curel Therapeutic Moisturizing Cream, Fragrance-Free  Curel Therapeutic Moisturizing Lotion, Fragrance-Free  Curel Therapeutic Moisturizing Lotion, Original Formula  Eucerin  Daily Replenishing Lotion  Eucerin Dry Skin Therapy Plus Alpha Hydroxy Crme  Eucerin Dry Skin Therapy Plus Alpha Hydroxy Lotion  Eucerin Original Crme  Eucerin Original Lotion  Eucerin Plus Crme Eucerin Plus Lotion  Eucerin TriLipid Replenishing Lotion  Keri Anti-Bacterial Hand Lotion  Keri Deep Conditioning Original Lotion Dry Skin Formula Softly Scented  Keri Deep Conditioning Original Lotion, Fragrance Free Sensitive Skin Formula  Keri Lotion Fast Absorbing Fragrance Free Sensitive Skin Formula  Keri Lotion Fast Absorbing Softly Scented Dry Skin Formula  Keri Original Lotion  Keri Skin Renewal Lotion Keri Silky Smooth Lotion  Keri Silky Smooth Sensitive Skin Lotion  Nivea Body Creamy Conditioning Oil  Nivea Body Extra Enriched Lotion  Nivea Body Original Lotion  Nivea Body Sheer Moisturizing Lotion Nivea Crme  Nivea Skin Firming Lotion  NutraDerm 30 Skin Lotion  NutraDerm Skin Lotion  NutraDerm Therapeutic Skin Cream  NutraDerm Therapeutic Skin Lotion  ProShield Protective Hand Cream  Provon moisturizing lotion  Please read over the following fact sheets that you were given.

## 2023-10-12 ENCOUNTER — Encounter (HOSPITAL_COMMUNITY): Payer: Self-pay

## 2023-10-12 LAB — SURGICAL PCR SCREEN
MRSA, PCR: NEGATIVE
Staphylococcus aureus: NEGATIVE

## 2023-10-12 NOTE — Progress Notes (Addendum)
 Anesthesia Chart Review:  Case: [id number] Date/Time: 10/23/23 1017   Procedure: TRANSFORAMINAL LUMBAR INTERBODY FUSION (TLIF) WITH PEDICLE SCREW FIXATION 2 LEVEL - TLIF - L3-L4 - L4-L5 - Interbody Fusion   Anesthesia type: General   Diagnosis: Spinal stenosis, lumbar region, with neurogenic claudication [M48.062]   Pre-op diagnosis: Spinal stenosis lumbar region with neurogenic claudication   [facility] ROOM 18 / [facility]   Surgeons: Gearl Keens, MD       DISCUSSION: Patient is a 73 year old male scheduled for the above procedure.  History includes never smoker, CAD (MI s/p DES LAD stent 01/15/15), ischemic cardiomyopathy (EF 40-45% post MI with apical & anteroseptal akinesis; EF > 55% 07/2021), CHF, DM2 (with  diabetic neuropathy, nephropathy).   He had preoperative cardiology evaluation on 09/25/2023 by Everrett Hodgkin, NP at [facility].  He denied CV symptoms but was sedentary particularly since his back issues. Nuclear stress test was ordered and done on 10/05/23 showing prior infarct in LAD with no ischemia, LVEF 35%, intermediate risk study. She reviewed with cardiologist Dr. Joetta Mustache who read that test: "SPECT images demonstrate prior infarct in LAD territory without ischemia, LVEF 35%. Infarct consistent with 2019 nuclear MPS results. Pt with h/o NSTEMI in 2016 with PCI to mLAD and residual disease in OM1. LVEF 40-45% with apical and anteroseptal akinesis in 2016. EF returned to normal > 55% on last ECHO 07/2021. Patient optimized for L3-4, L4-5 lumbar fusion surgery from cardiac perspective, moderate risk. Hold aspirin 5 days prior to surgery. Resume as soon as deemed safe according to Surgeon. Will order repeat ECHO to evaluate cardiac function with reported reduced EF on nuclear MPS. Pt asymptomatic and euvolemic." Echo was done on 10/13/23 and showed moderate concentric LHV, mild LV systolic dysfunction with LVEF 45%, normal LV wall motion, grade 1 diastolic dysfunction, normal RV  systolic function, mild MR, trivial PR/TR.  He reported instructions to hold Ozempic for 2 weeks and aspirin 1 week prior to surgery.  Last Jardiance plan for 10/19/2023.  Requested 09/25/23 EKG from [facility].   Anesthesia team to evaluate on the day of surgery.    VS: BP (!) 179/97   Pulse (!) 101   Temp 36.5 C   Resp 18   Ht 5\' 10"  (1.778 m)   Wt 98.9 kg   SpO2 99%   BMI 31.28 kg/m    PROVIDERS: Antonio Baumgarten, MD is PCP  Burney Carter, MD is cardiologist Tara Fanti, MD is endocrinologist   LABS: Labs reviewed: Acceptable for surgery. (all labs ordered are listed, but only abnormal results are displayed)  Labs Reviewed  HEMOGLOBIN A1C - Abnormal; Notable for the following components:      Result Value   Hgb A1c MFr Bld 7.8 (*)    All other components within normal limits  BASIC METABOLIC PANEL WITH GFR - Abnormal; Notable for the following components:   Glucose, Bld 222 (*)    All other components within normal limits  GLUCOSE, CAPILLARY - Abnormal; Notable for the following components:   Glucose-Capillary 247 (*)    All other components within normal limits  SURGICAL PCR SCREEN  CBC  TYPE AND SCREEN     EKG: 10/05/23: Tracing requested. Requested 10/17/23. Per Narrative in [place] [facility]: Sinus rhythm  Right bundle branch block  Abnormal ECG   CV: Echo 10/13/23 ([facility]):  MILD LEFT VENTRICULAR SYSTOLIC DYSFUNCTION WITH MODERATE LVH  ESTIMATED EF: 45%. LV wall motion is documented asa normal.   NORMAL LA  PRESSURES WITH DIASTOLIC DYSFUNCTION (GRADE 1)  NORMAL RIGHT VENTRICULAR SYSTOLIC FUNCTION  VALVULAR REGURGITATION: No AR, MILD MR, TRIVIAL PR, TRIVIAL TR  NO VALVULAR STENOSIS  - Comparison LVEF 35% 4/3/5 & 49% 02/20/18 stress tests; TTE 07/20/21: Normal LV/RV systolic function, LVEF > 55%   Nuclear stress test 10/05/23 ([facility]): Stress test is abnormal.  No chest pain, EKG nondiagnostic due to baseline ST-T changes.  SPECT images  demonstrate prior infarct in LAD territory as above with no  ischemia, sum difference score 0.  Left ventricle normal in size with LVEF of 35%.  No significant TID.  Intermediate risk study.  - Comparison 02/20/18: LVEF 49%, fixed apical defect with no reversible ischemia    Cardiac cath 01/15/15:   1st Mrg lesion, 35% stenosed.   Mid LAD lesion, 99% stenosed.   There is mild to moderate left ventricular systolic dysfunction.  - s/p DES LAD   Past Medical History:  Diagnosis Date   Actinic keratosis    Diabetes mellitus without complication (HCC)    type 2   Diabetic nephropathy (HCC)    Diabetic retinopathy (HCC)    Dysplastic nevus 09/18/2023   right calf - severe atypia close to margin recheck at 6 month follow up may need additional treatment   History of kidney stones    Myocardial infarction [facility]) 2016   ONE STENT    Past Surgical History:  Procedure Laterality Date   CARDIAC CATHETERIZATION N/A 01/15/2015   Procedure: Left Heart Cath and Coronary Angiography;  Surgeon: Ronney Cola, MD;  Location: [facility] INVASIVE CV LAB;  Service: Cardiovascular;  Laterality: N/A;   CARDIAC CATHETERIZATION N/A 01/15/2015   Procedure: Coronary Stent Intervention;  Surgeon: Wenona Hamilton, MD;  Location: [facility] INVASIVE CV LAB;  Service: Cardiovascular;  Laterality: N/A;   COLONOSCOPY WITH PROPOFOL N/A 04/17/2018   Procedure: COLONOSCOPY WITH PROPOFOL;  Surgeon: Deveron Fly, MD;  Location: [facility] ENDOSCOPY;  Service: Endoscopy;  Laterality: N/A;   CORONARY ANGIOPLASTY     CYSTECTOMY  1972   CYSTOSCOPY WITH STENT PLACEMENT Left 07/11/2019   Procedure: CYSTOSCOPY WITH STENT PLACEMENT;  Surgeon: Dustin Gimenez, MD;  Location: [facility] ORS;  Service: Urology;  Laterality: Left;   CYSTOSCOPY/URETEROSCOPY/HOLMIUM LASER/STENT PLACEMENT Left 07/22/2019   Procedure: CYSTOSCOPY/URETEROSCOPY/HOLMIUM LASER/STENT Exchange;  Surgeon: Dustin Gimenez, MD;  Location: [facility] ORS;  Service: Urology;   Laterality: Left;   EYE SURGERY     PILONIDAL CYST EXCISION      MEDICATIONS:  Alpha-Lipoic Acid 600 MG CAPS   Ascorbic Acid (VITAMIN C) 1000 MG tablet   aspirin EC 81 MG tablet   atorvastatin (LIPITOR) 20 MG tablet   BD PEN NEEDLE NANO 2ND GEN 32G X 4 MM MISC   Biotin 10000 MCG TABS   brimonidine (ALPHAGAN) 0.2 % ophthalmic solution   Carboxymethylcellul-Glycerin (LUBRICATING EYE DROPS OP)   cetirizine (ZYRTEC) 10 MG tablet   Cholecalciferol (VITAMIN D) 50 MCG (2000 UT) tablet   Chromium Picolinate (CHROMIUM PICOLATE PO)   dorzolamide-timolol (COSOPT) 22.3-6.8 MG/ML ophthalmic solution   glimepiride (AMARYL) 1 MG tablet   JARDIANCE 25 MG TABS tablet   latanoprost (XALATAN) 0.005 % ophthalmic solution   Melatonin 5 MG TABS   metFORMIN (GLUCOPHAGE) 1000 MG tablet   Multiple Vitamin (MULTIVITAMIN WITH MINERALS) TABS tablet   Multiple Vitamins-Minerals (PRESERVISION AREDS 2) CAPS   Omega-3 Fatty Acids (FISH OIL) 1000 MG CAPS   OZEMPIC, 2 MG/DOSE, 8 MG/3ML SOPN   pioglitazone (ACTOS) 30 MG tablet   Probiotic  CAPS   tadalafil (CIALIS) 5 MG tablet   testosterone cypionate (DEPOTESTOSTERONE CYPIONATE) 200 MG/ML injection   TURMERIC PO   VITAMIN K PO   No current facility-administered medications for this encounter.    Ella Gun, PA-C Surgical Short Stay/Anesthesiology [facility] Phone [phone] [facility] Phone [phone] 10/17/2023 6:16 PM

## 2023-10-12 NOTE — Progress Notes (Signed)
 PCP - Roderic Palau Hande,MD Cardiologist - Biagio Quint  PPM/ICD - denies Device Orders -  Rep Notified -   Chest x-ray -  EKG - 09/25/23 -requested Stress Test - 10/05/23 ECHO - 07/20/21; pt states he is scheduled for one on 4/10 25 Cardiac Cath -   Sleep Study - deneis CPAP -   Fasting Blood Sugar - 100-125 Checks Blood Sugar - pt usually wears a CGM  Last dose of GLP1 agonist-  10/08/23 GLP1 instructions: pt states he was instructed by Dr. Wynetta Emery to hold Ozempic for two weeks.   Blood Thinner Instructions:na Aspirin Instructions:per Dr. Lonie Peak instructions,you will stop aspirin one week prior to surgery.  ERAS Protcol -no PRE-SURGERY Ensure or G2-   COVID TEST- na    Anesthesia review: yes-CAD,hx MI  Patient denies shortness of breath, fever, cough and chest pain at PAT appointment   All instructions explained to the patient, with a verbal understanding of the material. Patient agrees to go over the instructions while at home for a better understanding. Patient also instructed to wear a mask when out in public prior to surgery. The opportunity to ask questions was provided.

## 2023-10-17 NOTE — Anesthesia Preprocedure Evaluation (Addendum)
 Anesthesia Evaluation  Patient identified by MRN, date of birth, ID band Patient awake    Reviewed: Allergy & Precautions, NPO status , Patient's Chart, lab work & pertinent test results  History of Anesthesia Complications Negative for: history of anesthetic complications  Airway Mallampati: II  TM Distance: >3 FB Neck ROM: Full    Dental  (+) Dental Advisory Given, Caps   Pulmonary neg sleep apnea, neg COPD, Not current smoker   Pulmonary exam normal breath sounds clear to auscultation       Cardiovascular (-) hypertension+ Past MI and + Cardiac Stents  (-) CHF Normal cardiovascular exam(-) dysrhythmias (-) Valvular Problems/Murmurs Rhythm:Regular Rate:Normal  Echo 10/13/23 ([facility]):  MILD LEFT VENTRICULAR SYSTOLIC DYSFUNCTION WITH MODERATE LVH  ESTIMATED EF: 45%. LV wall motion is documented asa normal.   NORMAL LA PRESSURES WITH DIASTOLIC DYSFUNCTION (GRADE 1)  NORMAL RIGHT VENTRICULAR SYSTOLIC FUNCTION  VALVULAR REGURGITATION: No AR, MILD MR, TRIVIAL PR, TRIVIAL TR  NO VALVULAR STENOSIS   - Comparison LVEF 35% 4/3/5 & 49% 02/20/18 stress tests; TTE 07/20/21: Normal LV/RV systolic function, LVEF > 55%     Nuclear stress test 10/05/23 ([facility]): Stress test is abnormal.  No chest pain, EKG nondiagnostic due to baseline ST-T changes.  SPECT images demonstrate prior infarct in LAD territory as above with no  ischemia, sum difference score 0.  Left ventricle normal in size with LVEF of 35%.  No significant TID.  Intermediate risk study.  - Comparison 02/20/18: LVEF 49%, fixed apical defect with no reversible ischemia     Neuro/Psych neg Seizures    GI/Hepatic Neg liver ROS,GERD  Medicated and Controlled,,  Endo/Other  diabetes, Type 2, Oral Hypoglycemic Agents    Renal/GU Renal InsufficiencyRenal disease     Musculoskeletal   Abdominal   Peds  Hematology   Anesthesia Other Findings   Reproductive/Obstetrics                              Anesthesia Physical Anesthesia Plan  ASA: 4  Anesthesia Plan: General   Post-op Pain Management: Ofirmev  IV (intra-op)*   Induction: Intravenous  PONV Risk Score and Plan: 3 and Ondansetron , Dexamethasone  and Treatment may vary due to age or medical condition  Airway Management Planned: Oral ETT  Additional Equipment:   Intra-op Plan:   Post-operative Plan: Extubation in OR  Informed Consent: I have reviewed the patients History and Physical, chart, labs and discussed the procedure including the risks, benefits and alternatives for the proposed anesthesia with the patient or authorized representative who has indicated his/her understanding and acceptance.     Dental advisory given  Plan Discussed with: CRNA  Anesthesia Plan Comments: (PAT note written 10/17/2023 by Allison Zelenak, PA-C.  )        Anesthesia Quick Evaluation

## 2023-10-23 ENCOUNTER — Other Ambulatory Visit: Payer: Self-pay

## 2023-10-23 ENCOUNTER — Inpatient Hospital Stay (HOSPITAL_COMMUNITY)

## 2023-10-23 ENCOUNTER — Inpatient Hospital Stay (HOSPITAL_COMMUNITY): Payer: Self-pay | Admitting: Vascular Surgery

## 2023-10-23 ENCOUNTER — Encounter (HOSPITAL_COMMUNITY)
Admission: RE | Disposition: A | Discharge status: Home / Self Care | Payer: Self-pay | Source: Home / Self Care | Attending: Neurosurgery

## 2023-10-23 ENCOUNTER — Encounter (HOSPITAL_COMMUNITY): Payer: Self-pay | Admitting: Neurosurgery

## 2023-10-23 ENCOUNTER — Inpatient Hospital Stay (HOSPITAL_BASED_OUTPATIENT_CLINIC_OR_DEPARTMENT_OTHER)

## 2023-10-23 ENCOUNTER — Observation Stay (HOSPITAL_COMMUNITY)
Admission: RE | Admit: 2023-10-23 | Discharge: 2023-10-24 | Disposition: A | Discharge status: Home / Self Care | Attending: Neurosurgery | Admitting: Neurosurgery

## 2023-10-23 DIAGNOSIS — M48061 Spinal stenosis, lumbar region without neurogenic claudication: Secondary | ICD-10-CM | POA: Diagnosis not present

## 2023-10-23 DIAGNOSIS — M532X6 Spinal instabilities, lumbar region: Secondary | ICD-10-CM | POA: Insufficient documentation

## 2023-10-23 DIAGNOSIS — Z7984 Long term (current) use of oral hypoglycemic drugs: Secondary | ICD-10-CM | POA: Diagnosis not present

## 2023-10-23 DIAGNOSIS — M7138 Other bursal cyst, other site: Secondary | ICD-10-CM | POA: Diagnosis not present

## 2023-10-23 DIAGNOSIS — E119 Type 2 diabetes mellitus without complications: Secondary | ICD-10-CM | POA: Diagnosis not present

## 2023-10-23 DIAGNOSIS — M48062 Spinal stenosis, lumbar region with neurogenic claudication: Secondary | ICD-10-CM | POA: Diagnosis not present

## 2023-10-23 DIAGNOSIS — Z79899 Other long term (current) drug therapy: Secondary | ICD-10-CM | POA: Insufficient documentation

## 2023-10-23 DIAGNOSIS — Z01818 Encounter for other preprocedural examination: Secondary | ICD-10-CM

## 2023-10-23 DIAGNOSIS — I252 Old myocardial infarction: Secondary | ICD-10-CM | POA: Diagnosis not present

## 2023-10-23 DIAGNOSIS — Z7982 Long term (current) use of aspirin: Secondary | ICD-10-CM | POA: Insufficient documentation

## 2023-10-23 HISTORY — PX: TRANSFORAMINAL LUMBAR INTERBODY FUSION (TLIF) WITH PEDICLE SCREW FIXATION 2 LEVEL: SHX6142

## 2023-10-23 LAB — GLUCOSE, CAPILLARY
Glucose-Capillary: 231 mg/dL — ABNORMAL HIGH (ref 70–99)
Glucose-Capillary: 175 mg/dL — ABNORMAL HIGH (ref 70–99)
Glucose-Capillary: 200 mg/dL — ABNORMAL HIGH (ref 70–99)
Glucose-Capillary: 180 mg/dL — ABNORMAL HIGH (ref 70–99)
Glucose-Capillary: 249 mg/dL — ABNORMAL HIGH (ref 70–99)

## 2023-10-23 LAB — ABO/RH: ABO/RH(D): O NEG

## 2023-10-23 SURGERY — TRANSFORAMINAL LUMBAR INTERBODY FUSION (TLIF) WITH PEDICLE SCREW FIXATION 2 LEVEL
Anesthesia: General | Site: Spine Lumbar

## 2023-10-23 MED ORDER — ALUM & MAG HYDROXIDE-SIMETH 200-200-20 MG/5ML PO SUSP: 30.0000 mL | Freq: Four times a day (QID) | ORAL | Status: DC | PRN

## 2023-10-23 MED ORDER — INSULIN ASPART 100 UNIT/ML IJ SOLN
0.0000 [IU] | Freq: Every day | INTRAMUSCULAR | Status: DC
  Administered 2023-10-23: 2 [IU] via SUBCUTANEOUS

## 2023-10-23 MED ORDER — ADULT MULTIVITAMIN W/MINERALS CH
1.0000 | ORAL_TABLET | Freq: Every day | ORAL | Status: DC
  Administered 2023-10-23: 1 via ORAL
  Filled 2023-10-23: qty 1

## 2023-10-23 MED ORDER — LATANOPROST 0.005 % OP SOLN
1.0000 [drp] | Freq: Every day | OPHTHALMIC | Status: DC
Start: 2023-10-23 — End: 2023-10-24
  Filled 2023-10-23: qty 2.5

## 2023-10-23 MED ORDER — 0.9 % SODIUM CHLORIDE (POUR BTL) OPTIME
TOPICAL | Status: DC | PRN
  Administered 2023-10-23: 1000 mL

## 2023-10-23 MED ORDER — DEXAMETHASONE SODIUM PHOSPHATE 10 MG/ML IJ SOLN
INTRAMUSCULAR | Status: DC | PRN
  Administered 2023-10-23: 5 mg via INTRAVENOUS

## 2023-10-23 MED ORDER — ALBUMIN HUMAN 5 % IV SOLN: INTRAVENOUS | Status: DC | PRN

## 2023-10-23 MED ORDER — LACTATED RINGERS IV SOLN: INTRAVENOUS | Status: DC | PRN

## 2023-10-23 MED ORDER — ROCURONIUM BROMIDE 10 MG/ML (PF) SYRINGE
PREFILLED_SYRINGE | INTRAVENOUS | Status: AC
  Filled 2023-10-23: qty 10

## 2023-10-23 MED ORDER — INSULIN ASPART 100 UNIT/ML IJ SOLN
0.0000 [IU] | Freq: Three times a day (TID) | INTRAMUSCULAR | Status: DC
  Administered 2023-10-24: 3 [IU] via SUBCUTANEOUS

## 2023-10-23 MED ORDER — INSULIN ASPART 100 UNIT/ML IJ SOLN
0.0000 [IU] | INTRAMUSCULAR | Status: AC | PRN
  Administered 2023-10-23 (×3): 2 [IU] via SUBCUTANEOUS
  Filled 2023-10-23: qty 1

## 2023-10-23 MED ORDER — LIDOCAINE 2% (20 MG/ML) 5 ML SYRINGE
INTRAMUSCULAR | Status: DC | PRN
  Administered 2023-10-23: 100 mg via INTRAVENOUS

## 2023-10-23 MED ORDER — SEMAGLUTIDE (2 MG/DOSE) 8 MG/3ML ~~LOC~~ SOPN
2.0000 mg | PEN_INJECTOR | SUBCUTANEOUS | Status: DC
Start: 2023-10-23 — End: 2023-10-23

## 2023-10-23 MED ORDER — ONDANSETRON HCL 4 MG/2ML IJ SOLN
INTRAMUSCULAR | Status: DC | PRN
  Administered 2023-10-23: 4 mg via INTRAVENOUS

## 2023-10-23 MED ORDER — RISAQUAD PO CAPS
1.0000 | ORAL_CAPSULE | Freq: Every day | ORAL | Status: DC
  Administered 2023-10-23: 1 via ORAL
  Filled 2023-10-23: qty 1

## 2023-10-23 MED ORDER — CHLORHEXIDINE GLUCONATE CLOTH 2 % EX PADS: 6.0000 | MEDICATED_PAD | Freq: Once | CUTANEOUS | Status: DC

## 2023-10-23 MED ORDER — VITAMIN C 500 MG PO TABS
1000.0000 mg | ORAL_TABLET | Freq: Every day | ORAL | Status: DC
  Administered 2023-10-23: 1000 mg via ORAL
  Filled 2023-10-23: qty 2

## 2023-10-23 MED ORDER — PHENYLEPHRINE 80 MCG/ML (10ML) SYRINGE FOR IV PUSH (FOR BLOOD PRESSURE SUPPORT)
PREFILLED_SYRINGE | INTRAVENOUS | Status: AC
  Filled 2023-10-23: qty 10

## 2023-10-23 MED ORDER — LACTATED RINGERS IV SOLN: INTRAVENOUS | Status: DC

## 2023-10-23 MED ORDER — SUGAMMADEX SODIUM 200 MG/2ML IV SOLN
INTRAVENOUS | Status: DC | PRN
  Administered 2023-10-23: 190.6 mg via INTRAVENOUS

## 2023-10-23 MED ORDER — PIOGLITAZONE HCL 30 MG PO TABS
30.0000 mg | ORAL_TABLET | Freq: Every day | ORAL | Status: DC
  Administered 2023-10-23: 30 mg via ORAL
  Filled 2023-10-23: qty 2
  Filled 2023-10-23 (×2): qty 1

## 2023-10-23 MED ORDER — FENTANYL CITRATE (PF) 250 MCG/5ML IJ SOLN
INTRAMUSCULAR | Status: AC
  Filled 2023-10-23: qty 5

## 2023-10-23 MED ORDER — THROMBIN 20000 UNITS EX SOLR: CUTANEOUS | Status: DC | PRN

## 2023-10-23 MED ORDER — ALPHA-LIPOIC ACID 600 MG PO CAPS
600.0000 mg | ORAL_CAPSULE | Freq: Every day | ORAL | Status: DC
Start: 2023-10-23 — End: 2023-10-23

## 2023-10-23 MED ORDER — LIDOCAINE-EPINEPHRINE 1 %-1:100000 IJ SOLN
INTRAMUSCULAR | Status: DC | PRN
  Administered 2023-10-23: 10 mL

## 2023-10-23 MED ORDER — ROCURONIUM BROMIDE 10 MG/ML (PF) SYRINGE
PREFILLED_SYRINGE | INTRAVENOUS | Status: DC | PRN
  Administered 2023-10-23: 10 mg via INTRAVENOUS
  Administered 2023-10-23 (×2): 20 mg via INTRAVENOUS
  Administered 2023-10-23: 70 mg via INTRAVENOUS
  Administered 2023-10-23 (×2): 20 mg via INTRAVENOUS
  Administered 2023-10-23: 10 mg via INTRAVENOUS

## 2023-10-23 MED ORDER — POLYVINYL ALCOHOL 1.4 % OP SOLN: Freq: Every day | OPHTHALMIC | Status: DC | PRN

## 2023-10-23 MED ORDER — HYDROCODONE-ACETAMINOPHEN 5-325 MG PO TABS
2.0000 | ORAL_TABLET | ORAL | Status: DC | PRN
  Administered 2023-10-24 (×2): 2 via ORAL
  Filled 2023-10-23 (×2): qty 2

## 2023-10-23 MED ORDER — ACETAMINOPHEN 325 MG PO TABS: 650.0000 mg | ORAL_TABLET | ORAL | Status: DC | PRN

## 2023-10-23 MED ORDER — ASPIRIN 81 MG PO TBEC
81.0000 mg | DELAYED_RELEASE_TABLET | Freq: Every evening | ORAL | Status: DC
  Administered 2023-10-23: 81 mg via ORAL
  Filled 2023-10-23: qty 1

## 2023-10-23 MED ORDER — BUPIVACAINE LIPOSOME 1.3 % IJ SUSP
INTRAMUSCULAR | Status: DC | PRN
  Administered 2023-10-23: 20 mL

## 2023-10-23 MED ORDER — HYDROMORPHONE HCL 1 MG/ML IJ SOLN
INTRAMUSCULAR | Status: AC
  Filled 2023-10-23: qty 0.5

## 2023-10-23 MED ORDER — GLIMEPIRIDE 1 MG PO TABS
1.0000 mg | ORAL_TABLET | Freq: Every day | ORAL | Status: DC
  Administered 2023-10-24: 1 mg via ORAL
  Filled 2023-10-23: qty 1

## 2023-10-23 MED ORDER — PANTOPRAZOLE SODIUM 40 MG IV SOLR: 40.0000 mg | Freq: Every day | INTRAVENOUS | Status: DC

## 2023-10-23 MED ORDER — TURMERIC 500 MG PO CAPS: 320.0000 mg | ORAL_CAPSULE | Freq: Every day | ORAL | Status: DC

## 2023-10-23 MED ORDER — LORATADINE 10 MG PO TABS
10.0000 mg | ORAL_TABLET | Freq: Every day | ORAL | Status: DC
  Administered 2023-10-23: 10 mg via ORAL
  Filled 2023-10-23: qty 1

## 2023-10-23 MED ORDER — HYDROMORPHONE HCL 1 MG/ML IJ SOLN: 0.5000 mg | INTRAMUSCULAR | Status: DC | PRN

## 2023-10-23 MED ORDER — PROPOFOL 10 MG/ML IV BOLUS
INTRAVENOUS | Status: DC | PRN
  Administered 2023-10-23: 160 mg via INTRAVENOUS
  Administered 2023-10-23: 40 mg via INTRAVENOUS

## 2023-10-23 MED ORDER — ORAL CARE MOUTH RINSE: 15.0000 mL | Freq: Once | OROMUCOSAL | Status: AC

## 2023-10-23 MED ORDER — ACETAMINOPHEN 650 MG RE SUPP: 650.0000 mg | RECTAL | Status: DC | PRN

## 2023-10-23 MED ORDER — MENTHOL 3 MG MT LOZG: 1.0000 | LOZENGE | OROMUCOSAL | Status: DC | PRN

## 2023-10-23 MED ORDER — LIDOCAINE-EPINEPHRINE 1 %-1:100000 IJ SOLN
INTRAMUSCULAR | Status: AC
  Filled 2023-10-23: qty 1

## 2023-10-23 MED ORDER — ONDANSETRON HCL 4 MG/2ML IJ SOLN
INTRAMUSCULAR | Status: AC
  Filled 2023-10-23: qty 2

## 2023-10-23 MED ORDER — PHENYLEPHRINE HCL-NACL 20-0.9 MG/250ML-% IV SOLN
INTRAVENOUS | Status: DC | PRN
  Administered 2023-10-23: 80 ug via INTRAVENOUS
  Administered 2023-10-23: 120 ug via INTRAVENOUS
  Administered 2023-10-23: 40 ug via INTRAVENOUS
  Administered 2023-10-23: 30 ug/min via INTRAVENOUS
  Administered 2023-10-23: 80 ug via INTRAVENOUS
  Administered 2023-10-23: 40 ug via INTRAVENOUS
  Administered 2023-10-23: 20 ug via INTRAVENOUS

## 2023-10-23 MED ORDER — METFORMIN HCL 500 MG PO TABS
1000.0000 mg | ORAL_TABLET | Freq: Two times a day (BID) | ORAL | Status: DC
  Administered 2023-10-24: 1000 mg via ORAL
  Filled 2023-10-23: qty 2

## 2023-10-23 MED ORDER — DEXMEDETOMIDINE HCL IN NACL 80 MCG/20ML IV SOLN
INTRAVENOUS | Status: DC | PRN
  Administered 2023-10-23 (×3): 4 ug via INTRAVENOUS

## 2023-10-23 MED ORDER — PHENOL 1.4 % MT LIQD: 1.0000 | OROMUCOSAL | Status: DC | PRN

## 2023-10-23 MED ORDER — MELATONIN 5 MG PO TABS: 2.5000 mg | ORAL_TABLET | Freq: Every day | ORAL | Status: DC

## 2023-10-23 MED ORDER — CYCLOBENZAPRINE HCL 10 MG PO TABS: 10.0000 mg | ORAL_TABLET | Freq: Three times a day (TID) | ORAL | Status: DC | PRN

## 2023-10-23 MED ORDER — MIDAZOLAM HCL 2 MG/2ML IJ SOLN
INTRAMUSCULAR | Status: AC
  Filled 2023-10-23: qty 2

## 2023-10-23 MED ORDER — ATORVASTATIN CALCIUM 10 MG PO TABS
20.0000 mg | ORAL_TABLET | Freq: Every evening | ORAL | Status: DC
  Administered 2023-10-23: 20 mg via ORAL
  Filled 2023-10-23: qty 2

## 2023-10-23 MED ORDER — ACETAMINOPHEN 10 MG/ML IV SOLN
INTRAVENOUS | Status: DC | PRN
  Administered 2023-10-23: 1000 mg via INTRAVENOUS

## 2023-10-23 MED ORDER — ONDANSETRON HCL 4 MG PO TABS: 4.0000 mg | ORAL_TABLET | Freq: Four times a day (QID) | ORAL | Status: DC | PRN

## 2023-10-23 MED ORDER — ONDANSETRON HCL 4 MG/2ML IJ SOLN
4.0000 mg | Freq: Four times a day (QID) | INTRAMUSCULAR | Status: DC | PRN
Start: 2023-10-23 — End: 2023-10-24

## 2023-10-23 MED ORDER — THROMBIN 20000 UNITS EX SOLR
CUTANEOUS | Status: AC
  Filled 2023-10-23: qty 20000

## 2023-10-23 MED ORDER — SODIUM CHLORIDE 0.9 % IV SOLN
250.0000 mL | INTRAVENOUS | Status: DC
  Administered 2023-10-23: 250 mL via INTRAVENOUS

## 2023-10-23 MED ORDER — BRIMONIDINE TARTRATE 0.2 % OP SOLN
1.0000 [drp] | Freq: Two times a day (BID) | OPHTHALMIC | Status: DC
Start: 2023-10-23 — End: 2023-10-24
  Filled 2023-10-23: qty 5

## 2023-10-23 MED ORDER — INSULIN ASPART 100 UNIT/ML IJ SOLN: 0.0000 [IU] | Freq: Three times a day (TID) | INTRAMUSCULAR | Status: DC

## 2023-10-23 MED ORDER — DEXAMETHASONE SODIUM PHOSPHATE 10 MG/ML IJ SOLN
INTRAMUSCULAR | Status: AC
  Filled 2023-10-23: qty 1

## 2023-10-23 MED ORDER — DORZOLAMIDE HCL-TIMOLOL MAL 2-0.5 % OP SOLN
1.0000 [drp] | Freq: Two times a day (BID) | OPHTHALMIC | Status: DC
  Filled 2023-10-23: qty 10

## 2023-10-23 MED ORDER — CEFAZOLIN SODIUM-DEXTROSE 2-4 GM/100ML-% IV SOLN
2.0000 g | Freq: Three times a day (TID) | INTRAVENOUS | Status: AC
  Administered 2023-10-23 – 2023-10-24 (×2): 2 g via INTRAVENOUS
  Filled 2023-10-23 (×2): qty 100

## 2023-10-23 MED ORDER — CHLORHEXIDINE GLUCONATE 0.12 % MT SOLN
15.0000 mL | Freq: Once | OROMUCOSAL | Status: AC
  Administered 2023-10-23: 15 mL via OROMUCOSAL
  Filled 2023-10-23: qty 15

## 2023-10-23 MED ORDER — LIDOCAINE 2% (20 MG/ML) 5 ML SYRINGE
INTRAMUSCULAR | Status: AC
  Filled 2023-10-23: qty 5

## 2023-10-23 MED ORDER — SODIUM CHLORIDE 0.9 % IV SOLN: INTRAVENOUS | Status: DC | PRN

## 2023-10-23 MED ORDER — VITAMIN D 25 MCG (1000 UNIT) PO TABS
2000.0000 [IU] | ORAL_TABLET | Freq: Every day | ORAL | Status: DC
  Administered 2023-10-23: 2000 [IU] via ORAL
  Filled 2023-10-23: qty 2

## 2023-10-23 MED ORDER — CEFAZOLIN SODIUM-DEXTROSE 2-4 GM/100ML-% IV SOLN
2.0000 g | INTRAVENOUS | Status: AC
  Administered 2023-10-23: 2 g via INTRAVENOUS
  Filled 2023-10-23: qty 100

## 2023-10-23 MED ORDER — HYDROMORPHONE HCL 1 MG/ML IJ SOLN
INTRAMUSCULAR | Status: DC | PRN
  Administered 2023-10-23: .5 mg via INTRAVENOUS

## 2023-10-23 MED ORDER — ACETAMINOPHEN 10 MG/ML IV SOLN
INTRAVENOUS | Status: AC
  Filled 2023-10-23: qty 100

## 2023-10-23 MED ORDER — BIOTIN 10000 MCG PO TABS: 10.0000 mg | ORAL_TABLET | Freq: Every day | ORAL | Status: DC

## 2023-10-23 MED ORDER — SODIUM CHLORIDE 0.9% FLUSH
3.0000 mL | INTRAVENOUS | Status: DC | PRN
Start: 2023-10-23 — End: 2023-10-24

## 2023-10-23 MED ORDER — PROSIGHT PO TABS
1.0000 | ORAL_TABLET | Freq: Two times a day (BID) | ORAL | Status: DC
  Administered 2023-10-23: 1 via ORAL
  Filled 2023-10-23: qty 1

## 2023-10-23 MED ORDER — EMPAGLIFLOZIN 25 MG PO TABS
25.0000 mg | ORAL_TABLET | Freq: Every day | ORAL | Status: DC
Start: 2023-10-23 — End: 2023-10-24
  Filled 2023-10-23 (×2): qty 1

## 2023-10-23 MED ORDER — BUPIVACAINE LIPOSOME 1.3 % IJ SUSP
INTRAMUSCULAR | Status: AC
  Filled 2023-10-23: qty 20

## 2023-10-23 MED ORDER — PANTOPRAZOLE SODIUM 40 MG PO TBEC
40.0000 mg | DELAYED_RELEASE_TABLET | Freq: Every day | ORAL | Status: DC
  Administered 2023-10-23: 40 mg via ORAL
  Filled 2023-10-23: qty 1

## 2023-10-23 MED ORDER — FENTANYL CITRATE (PF) 250 MCG/5ML IJ SOLN
INTRAMUSCULAR | Status: DC | PRN
  Administered 2023-10-23 (×5): 50 ug via INTRAVENOUS

## 2023-10-23 MED ORDER — SODIUM CHLORIDE 0.9% FLUSH
3.0000 mL | Freq: Two times a day (BID) | INTRAVENOUS | Status: DC
  Administered 2023-10-23: 3 mL via INTRAVENOUS

## 2023-10-23 SURGICAL SUPPLY — 63 items
BAG COUNTER SPONGE SURGICOUNT (BAG) ×1 IMPLANT
BASKET BONE COLLECTION (BASKET) IMPLANT
BENZOIN TINCTURE PRP APPL 2/3 (GAUZE/BANDAGES/DRESSINGS) ×1 IMPLANT
BIT DRILL INV  3.1 (DRILL) IMPLANT
BLADE BONE MILL MEDIUM (MISCELLANEOUS) IMPLANT
BLADE CLIPPER SURG (BLADE) IMPLANT
BLADE SURG 11 STRL SS (BLADE) ×1 IMPLANT
BONE VIVIGEN FORMABLE 5.4CC (Bone Implant) ×1 IMPLANT
BUR MATCHSTICK NEURO 3.0 LAGG (BURR) ×1 IMPLANT
BUR PRECISION FLUTE 6.0 (BURR) ×1 IMPLANT
CAGE ALTERA 8X12-8 (Cage) IMPLANT
CANISTER SUCT 3000ML PPV (MISCELLANEOUS) ×1 IMPLANT
CNTNR URN SCR LID CUP LEK RST (MISCELLANEOUS) ×1 IMPLANT
COVER BACK TABLE 60X90IN (DRAPES) ×1 IMPLANT
DERMABOND ADVANCED .7 DNX12 (GAUZE/BANDAGES/DRESSINGS) ×1 IMPLANT
DRAPE C-ARM 42X72 X-RAY (DRAPES) ×1 IMPLANT
DRAPE C-ARMOR (DRAPES) ×1 IMPLANT
DRAPE HALF SHEET 40X57 (DRAPES) IMPLANT
DRAPE LAPAROTOMY 100X72X124 (DRAPES) ×1 IMPLANT
DRAPE POUCH INSTRU U-SHP 10X18 (DRAPES) ×1 IMPLANT
DRAPE SURG 17X23 STRL (DRAPES) ×1 IMPLANT
DRSG OPSITE 4X5.5 SM (GAUZE/BANDAGES/DRESSINGS) IMPLANT
DRSG OPSITE POSTOP 4X6 (GAUZE/BANDAGES/DRESSINGS) IMPLANT
DURAPREP 26ML APPLICATOR (WOUND CARE) ×1 IMPLANT
ELECTRODE REM PT RTRN 9FT ADLT (ELECTROSURGICAL) ×1 IMPLANT
EVACUATOR 1/8 PVC DRAIN (DRAIN) ×1 IMPLANT
GAUZE 4X4 16PLY ~~LOC~~+RFID DBL (SPONGE) IMPLANT
GAUZE SPONGE 4X4 12PLY STRL (GAUZE/BANDAGES/DRESSINGS) ×1 IMPLANT
GAUZE SPONGE 4X4 12PLY STRL LF (GAUZE/BANDAGES/DRESSINGS) IMPLANT
GLOVE BIO SURGEON STRL SZ7 (GLOVE) IMPLANT
GLOVE BIO SURGEON STRL SZ8 (GLOVE) ×2 IMPLANT
GLOVE BIOGEL PI IND STRL 7.0 (GLOVE) IMPLANT
GLOVE ECLIPSE 7.5 STRL STRAW (GLOVE) IMPLANT
GLOVE EXAM NITRILE XL STR (GLOVE) IMPLANT
GLOVE INDICATOR 8.5 STRL (GLOVE) ×4 IMPLANT
GOWN STRL REUS W/ TWL LRG LVL3 (GOWN DISPOSABLE) IMPLANT
GOWN STRL REUS W/ TWL XL LVL3 (GOWN DISPOSABLE) ×2 IMPLANT
GOWN STRL REUS W/TWL 2XL LVL3 (GOWN DISPOSABLE) IMPLANT
GRAFT BNE MATRIX VG FRMBL MD 5 (Bone Implant) IMPLANT
KIT BASIN OR (CUSTOM PROCEDURE TRAY) ×1 IMPLANT
KIT INFUSE X SMALL 1.4CC (Orthopedic Implant) IMPLANT
KIT TURNOVER KIT B (KITS) ×1 IMPLANT
MILL BONE PREP (MISCELLANEOUS) IMPLANT
NDL HYPO 25X1 1.5 SAFETY (NEEDLE) ×1 IMPLANT
NEEDLE HYPO 25X1 1.5 SAFETY (NEEDLE) ×1 IMPLANT
NS IRRIG 1000ML POUR BTL (IV SOLUTION) ×1 IMPLANT
PACK LAMINECTOMY NEURO (CUSTOM PROCEDURE TRAY) ×1 IMPLANT
PAD ARMBOARD POSITIONER FOAM (MISCELLANEOUS) ×3 IMPLANT
ROD LORD LIPPED TI 5.5X70 (Rod) IMPLANT
SCREW PA INVICT 5.5X35 (Screw) IMPLANT
SET SCREW SPNE (Screw) IMPLANT
SPACER ALTERA 26MM 10X14-8 (Spacer) IMPLANT
SPIKE FLUID TRANSFER (MISCELLANEOUS) ×1 IMPLANT
SPONGE SURGIFOAM ABS GEL 100 (HEMOSTASIS) ×1 IMPLANT
SPONGE T-LAP 4X18 ~~LOC~~+RFID (SPONGE) IMPLANT
STRIP CLOSURE SKIN 1/2X4 (GAUZE/BANDAGES/DRESSINGS) ×2 IMPLANT
SUT VIC AB 0 CT1 18XCR BRD8 (SUTURE) ×2 IMPLANT
SUT VIC AB 2-0 CT1 18 (SUTURE) ×1 IMPLANT
SUT VIC AB 4-0 PS2 27 (SUTURE) ×1 IMPLANT
TOWEL GREEN STERILE (TOWEL DISPOSABLE) ×1 IMPLANT
TOWEL GREEN STERILE FF (TOWEL DISPOSABLE) ×1 IMPLANT
TRAY FOLEY MTR SLVR 16FR STAT (SET/KITS/TRAYS/PACK) ×1 IMPLANT
WATER STERILE IRR 1000ML POUR (IV SOLUTION) ×1 IMPLANT

## 2023-10-23 NOTE — Op Note (Signed)
 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  Postoperative diagnosis: Same.  Procedure: Decompressive lumbar laminectomies with complete medial facetectomies and radical foraminotomies of the L3, L4 and L5 nerve roots at L3-4 and L4-5 in excess and requiring more work than would be needed with a standard interbody fusion.  2.  Transforaminal interbody fusion utilizing the globus Altera expandable cage with the cage placed from the right at L3-4 and from the left at L4-5 with an extensive mount of autograft material packed centrally.  3.  Cortical screw fixation L3-4 L4-5 utilizing the Alphatec cortical screw system.  Surgeon: Gearl Keens.  System: Beula [place].  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  Due to the patient's progression of clinical syndrome imaging findings and failed conservative treatment I recommended decompressive laminectomies and interbody fusions at those levels.  We extensively reviewed the risk and benefits of the operation of the patient as well as perioperative course expectations of outcome and alternatives of surgery and he understood and agreed to proceed forward.  Operative procedure: Patient was brought into the OR was induced under general anesthesia positioned prone the Wilson frame his back was prepped and draped in routine sterile fashion.  After infiltration of 10 cc lidocaine  with epi midline incision was made and Bovie electrocautery was used to take down the subcutaneous tissue and subperiosteal dissection carried lamina of L3-L4 and L5 bilaterally exposing the pedicle screw entry points at L3-L4 and L5.  Interoperative  x-ray confirmed indication appropriate level so the spinous process at L3 and L4 was removed central decompression was begun the facets were drilled down and complete laminectomies complete medial facetectomies were performed at both levels aggressively under biting the superior tickling facets allowed access to the lateral margin of the disc base radical foraminotomies were performed at L3-L4 and L5 with complete removal of the pars at L3 and L4 large synovial cyst on the left at L3-4 was also excised and removed.  At the end decompression at this point tension taken the interbody work the spaces were incised both sides cleaned out from both sides utilizing sequential distraction I elected to place the cage from the right at L3-4 and a left at L4-5 and with a distractor in place and after adequate endplate preparation discectomy I packed an extensive mount of autograft mix anteriorly and contralaterally then inserted the cages deployed at and expanded it.  This was all done under fluoroscopy to confirm good position.  I then placed 6 cortical screws under fluoroscopy again all screws excellent purchase postop imaging confirmed position of all the implants all the foramina were reinspected to confirm patency and no migration of graft material.  The wounds and copiously irrigated meticulous hemostasis was maintained Gelfoam was ON top of the dura I assembled the rods tightened the knots down compressed L3 against L4 and L4 against L5 and then placed a medium Hemovac drain injected Exparel  in the fascia and closed the wound in layers with interrupted Vicryl and a running 4 subcuticular.  Dermabond benzoin Steri-Strips and a sterile dressing was applied patient to cover him in stable condition.  At the end the case all needle count sponge counts were correct.

## 2023-10-23 NOTE — Plan of Care (Signed)

## 2023-10-23 NOTE — Anesthesia Procedure Notes (Signed)
 Procedure Name: Intubation Date/Time: 10/23/2023 1:08 PM  Performed by: Neomia Banner, RNPre-anesthesia Checklist: Patient identified, Emergency Drugs available, Suction available and Patient being monitored Patient Re-evaluated:Patient Re-evaluated prior to induction Oxygen Delivery Method: Circle system utilized Preoxygenation: Pre-oxygenation with 100% oxygen Induction Type: IV induction Ventilation: Oral airway inserted - appropriate to patient size and Two handed mask ventilation required Laryngoscope Size: Mac and 4 Grade View: Grade I Tube type: Oral Tube size: 7.5 mm Number of attempts: 1 Airway Equipment and Method: Stylet and Oral airway Placement Confirmation: ETT inserted through vocal cords under direct vision, positive ETCO2 and breath sounds checked- equal and bilateral Secured at: 23 cm Tube secured with: Tape Dental Injury: Teeth and Oropharynx as per pre-operative assessment

## 2023-10-23 NOTE — Transfer of Care (Signed)
 Immediate Anesthesia Transfer of Care Note  Patient: Jason Harrison  Procedure(s) Performed: TRANSFORAMINAL LUMBAR INTERBODY FUSION (TLIF) WITH PEDICLE SCREW FIXATION LUMBAR THREE-FOUR FOUR-FIVE (Spine Lumbar)  Patient Location: PACU  Anesthesia Type:General  Level of Consciousness: drowsy  Airway & Oxygen Therapy: Patient Spontanous Breathing and Patient connected to face mask oxygen  Post-op Assessment: Report given to RN and Post -op Vital signs reviewed and stable  Post vital signs: Reviewed and stable  Last Vitals:  Vitals Value Taken Time  BP 124/55 10/23/23 1705  Temp    Pulse 94 10/23/23 1706  Resp 12 10/23/23 1706  SpO2 100 % 10/23/23 1706  Vitals shown include unfiled device data.  Last Pain:  Vitals:   10/23/23 0857  TempSrc:   PainSc: 4       Patients Stated Pain Goal: 1 (10/23/23 0857)  Complications: No notable events documented.

## 2023-10-23 NOTE — H&P (Signed)
 Jason Harrison is an 73 y.o. male.   Chief Complaint: Back and right greater than left leg pain HPI: Jason Harrison is a very pleasant 73 year old gentleman progressive worsening back and right greater than left leg pain with difficulty walking and claudication.  Workup revealed severe spinal stenosis synovial cyst and instability L3-4 L4-5.  Due to the patient's progression of clinical syndrome imaging findings and failed conservative treatment I recommended decompressive laminectomies and interbody fusions at L3-4 and L4-5.  I extensively reviewed the risks and benefits of the operation with the patient as well as perioperative course expectations of outcome and alternatives of surgery and he understood and agreed to proceed forward.  Past Medical History:  Diagnosis Date   Actinic keratosis    Diabetes mellitus without complication (HCC)    type 2   Diabetic nephropathy (HCC)    Diabetic retinopathy (HCC)    Dysplastic nevus 09/18/2023   right calf - severe atypia close to margin recheck at 6 month follow up may need additional treatment   History of kidney stones    Myocardial infarction [facility]) 2016   ONE STENT    Past Surgical History:  Procedure Laterality Date   CARDIAC CATHETERIZATION N/A 01/15/2015   Procedure: Left Heart Cath and Coronary Angiography;  Surgeon: Ronney Cola, MD;  Location: [facility] INVASIVE CV LAB;  Service: Cardiovascular;  Laterality: N/A;   CARDIAC CATHETERIZATION N/A 01/15/2015   Procedure: Coronary Stent Intervention;  Surgeon: Wenona Hamilton, MD;  Location: [facility] INVASIVE CV LAB;  Service: Cardiovascular;  Laterality: N/A;   COLONOSCOPY WITH PROPOFOL  N/A 04/17/2018   Procedure: COLONOSCOPY WITH PROPOFOL ;  Surgeon: Deveron Fly, MD;  Location: [facility] ENDOSCOPY;  Service: Endoscopy;  Laterality: N/A;   CORONARY ANGIOPLASTY     CYSTECTOMY  1972   CYSTOSCOPY WITH STENT PLACEMENT Left 07/11/2019   Procedure: CYSTOSCOPY WITH STENT PLACEMENT;  Surgeon: Dustin Gimenez, MD;  Location: [facility] ORS;  Service: Urology;  Laterality: Left;   CYSTOSCOPY/URETEROSCOPY/HOLMIUM LASER/STENT PLACEMENT Left 07/22/2019   Procedure: CYSTOSCOPY/URETEROSCOPY/HOLMIUM LASER/STENT Exchange;  Surgeon: Dustin Gimenez, MD;  Location: [facility] ORS;  Service: Urology;  Laterality: Left;   EYE SURGERY     PILONIDAL CYST EXCISION      History reviewed. No pertinent family history. Social History:  reports that he has never smoked. He has never used smokeless tobacco. He reports that he does not drink alcohol  and does not use drugs.  Allergies:  Allergies  Allergen Reactions   Trulicity [Dulaglutide] Itching and Swelling    Medications Prior to Admission  Medication Sig Dispense Refill   Alpha-Lipoic Acid 600 MG CAPS Take 600 mg by mouth daily.     Ascorbic Acid  (VITAMIN C ) 1000 MG tablet Take 1,000 mg by mouth daily.     aspirin  EC 81 MG tablet Take 81 mg by mouth every evening.      atorvastatin  (LIPITOR) 20 MG tablet Take 20 mg by mouth every evening.      Biotin  10000 MCG TABS Take 10,000 mcg by mouth daily.     brimonidine  (ALPHAGAN ) 0.2 % ophthalmic solution Place 1 drop into both eyes 2 (two) times daily.     Carboxymethylcellul-Glycerin (LUBRICATING EYE DROPS OP) Place 1 drop into both eyes daily as needed (Pollen).     cetirizine (ZYRTEC) 10 MG tablet Take 10 mg by mouth every morning.      Cholecalciferol  (VITAMIN D ) 50 MCG (2000 UT) tablet Take 2,000 Units by mouth daily.     Chromium Picolinate (CHROMIUM PICOLATE  PO) Take 5,000 mcg by mouth daily.     dorzolamide -timolol  (COSOPT ) 22.3-6.8 MG/ML ophthalmic solution Place 1 drop into both eyes 2 (two) times daily.     glimepiride  (AMARYL ) 1 MG tablet Take 1 mg by mouth daily with breakfast.     JARDIANCE  25 MG TABS tablet Take 25 mg by mouth daily.     latanoprost  (XALATAN ) 0.005 % ophthalmic solution Place 1 drop into both eyes at bedtime.     Melatonin 5 MG TABS Take 2.5 mg by mouth at bedtime.     metFORMIN   (GLUCOPHAGE ) 1000 MG tablet Take 1,000 mg by mouth 2 (two) times daily.      Multiple Vitamin (MULTIVITAMIN WITH MINERALS) TABS tablet Take 1 tablet by mouth daily.     Multiple Vitamins-Minerals (PRESERVISION AREDS 2) CAPS Take 1 capsule by mouth 2 (two) times daily.     Omega-3 Fatty Acids (FISH OIL) 1000 MG CAPS Take 1,000 mg by mouth daily.     OZEMPIC , 2 MG/DOSE, 8 MG/3ML SOPN Inject 2 mg of amoxicillin into the skin once a week.     pioglitazone  (ACTOS ) 30 MG tablet Take 30 mg by mouth daily.     Probiotic CAPS Take 1 capsule by mouth daily.     tadalafil  (CIALIS ) 5 MG tablet Take 1 tablet (5 mg total) by mouth daily as needed for erectile dysfunction. (Patient taking differently: Take 5 mg by mouth daily.) 90 tablet 3   testosterone  cypionate (DEPOTESTOSTERONE CYPIONATE) 200 MG/ML injection ADMINISTER 1 ML(200 MG) IN THE MUSCLE EVERY 14 DAYS 10 mL 0   TURMERIC PO Take 320 mg by mouth daily.     VITAMIN K PO Take 1 capsule by mouth daily.     BD PEN NEEDLE NANO 2ND GEN 32G X 4 MM MISC USE 1 PEN NEEDLE ONCE DAILY      Results for orders placed or performed during the hospital encounter of 10/23/23 (from the past 48 hours)  Glucose, capillary     Status: Abnormal   Collection Time: 10/23/23  8:42 AM  Result Value Ref Range   Glucose-Capillary 231 (H) 70 - 99 mg/dL    Comment: Glucose reference range applies only to samples taken after fasting for at least 8 hours.  ABO/Rh     Status: None   Collection Time: 10/23/23  9:10 AM  Result Value Ref Range   ABO/RH(D)      O NEG Performed at [facility], [address], [place], [place] [address]   Glucose, capillary     Status: Abnormal   Collection Time: 10/23/23 10:07 AM  Result Value Ref Range   Glucose-Capillary 175 (H) 70 - 99 mg/dL    Comment: Glucose reference range applies only to samples taken after fasting for at least 8 hours.   No results found.  Review of Systems  Musculoskeletal:  Positive for back pain.   Neurological:  Positive for numbness.    Blood pressure (!) 128/92, pulse 96, temperature 97.9 F (36.6 C), temperature source Oral, resp. rate 18, height 5\' 10"  (1.778 m), weight 95.3 kg, SpO2 96%. Physical Exam HENT:     Head: Normocephalic.     Right Ear: Tympanic membrane normal.     Nose: Nose normal.     Mouth/Throat:     Mouth: Mucous membranes are moist.  Cardiovascular:     Rate and Rhythm: Normal rate.     Pulses: Normal pulses.  Pulmonary:     Effort: Pulmonary effort  is normal.  Musculoskeletal:        General: Normal range of motion.  Neurological:     Mental Status: He is alert.     Comments: Strength is 5 out of 5 iliopsoas, quads, hamstrings, gastrocs, into tibialis, EHL.      Assessment/Plan 73 year old presents for decompressive laminectomies and interbody fusions L3-4 L4-5  Ferris Hua, MD 10/23/2023, 10:38 AM

## 2023-10-23 NOTE — Anesthesia Procedure Notes (Signed)
 Arterial Line Insertion Start/End4/21/2025 12:15 PM, 10/23/2023 12:30 PM Performed by: Gorman Laughter, MD, Shyla Gayheart, CRNA  Patient location: Pre-op. Preanesthetic checklist: patient identified, IV checked, site marked, risks and benefits discussed, surgical consent, monitors and equipment checked, pre-op evaluation, timeout performed and anesthesia consent Lidocaine  1% used for infiltration Left, radial was placed Catheter size: 20 G Hand hygiene performed , maximum sterile barriers used  and Seldinger technique used Allen's test indicative of satisfactory collateral circulation Attempts: 2 Procedure performed using ultrasound guided technique. Following insertion, dressing applied. Post procedure assessment: normal and unchanged  Patient tolerated the procedure well with no immediate complications.

## 2023-10-24 ENCOUNTER — Encounter (HOSPITAL_COMMUNITY): Payer: Self-pay | Admitting: Neurosurgery

## 2023-10-24 DIAGNOSIS — M48061 Spinal stenosis, lumbar region without neurogenic claudication: Secondary | ICD-10-CM | POA: Diagnosis not present

## 2023-10-24 LAB — GLUCOSE, CAPILLARY: Glucose-Capillary: 163 mg/dL — ABNORMAL HIGH (ref 70–99)

## 2023-10-24 LAB — POCT I-STAT 7, (LYTES, BLD GAS, ICA,H+H)
Acid-base deficit: 4 mmol/L — ABNORMAL HIGH (ref 0.0–2.0)
Bicarbonate: 21.9 mmol/L (ref 20.0–28.0)
Calcium, Ion: 1.21 mmol/L (ref 1.15–1.40)
HCT: 35 % — ABNORMAL LOW (ref 39.0–52.0)
Hemoglobin: 11.9 g/dL — ABNORMAL LOW (ref 13.0–17.0)
O2 Saturation: 100 %
Patient temperature: 37.5
Potassium: 4.6 mmol/L (ref 3.5–5.1)
Sodium: 142 mmol/L (ref 135–145)
TCO2: 23 mmol/L (ref 22–32)
pCO2 arterial: 45.6 mmHg (ref 32–48)
pH, Arterial: 7.293 — ABNORMAL LOW (ref 7.35–7.45)
pO2, Arterial: 233 mmHg — ABNORMAL HIGH (ref 83–108)

## 2023-10-24 LAB — POCT I-STAT, CHEM 8
BUN: 20 mg/dL (ref 8–23)
BUN: 20 mg/dL (ref 8–23)
Calcium, Ion: 1.2 mmol/L (ref 1.15–1.40)
Calcium, Ion: 1.15 mmol/L (ref 1.15–1.40)
Chloride: 110 mmol/L (ref 98–111)
Chloride: 109 mmol/L (ref 98–111)
Creatinine, Ser: 0.7 mg/dL (ref 0.61–1.24)
Creatinine, Ser: 0.9 mg/dL (ref 0.61–1.24)
Glucose, Bld: 205 mg/dL — ABNORMAL HIGH (ref 70–99)
Glucose, Bld: 185 mg/dL — ABNORMAL HIGH (ref 70–99)
HCT: 40 % (ref 39.0–52.0)
HCT: 36 % — ABNORMAL LOW (ref 39.0–52.0)
Hemoglobin: 13.6 g/dL (ref 13.0–17.0)
Hemoglobin: 12.2 g/dL — ABNORMAL LOW (ref 13.0–17.0)
Potassium: 4.3 mmol/L (ref 3.5–5.1)
Potassium: 4.6 mmol/L (ref 3.5–5.1)
Sodium: 140 mmol/L (ref 135–145)
Sodium: 142 mmol/L (ref 135–145)
TCO2: 24 mmol/L (ref 22–32)
TCO2: 23 mmol/L (ref 22–32)

## 2023-10-24 MED ORDER — CYCLOBENZAPRINE HCL 10 MG PO TABS: 10.0000 mg | ORAL_TABLET | Freq: Three times a day (TID) | ORAL | 0 refills | Status: AC | PRN

## 2023-10-24 MED ORDER — HYDROCODONE-ACETAMINOPHEN 5-325 MG PO TABS: 2.0000 | ORAL_TABLET | ORAL | 0 refills | Status: AC | PRN

## 2023-10-24 NOTE — Care Management Obs Status (Signed)
 MEDICARE OBSERVATION STATUS NOTIFICATION   Patient Details  Name: Jason Harrison MRN: [id number] Date of Birth: 1951/06/15   Medicare Observation Status Notification Given:  Yes    Jonathan Neighbor, RN 10/24/2023, 8:51 AM

## 2023-10-24 NOTE — Discharge Instructions (Signed)
 Wound Care Keep incision covered and dry until post op day 3. You may remove the Honeycomb dressing on post op day 3. Leave steri-strips on back.  They will fall off by themselves. Do not put any creams, lotions, or ointments on incision. You are fine to shower. Let water run over incision and pat dry.   Activity Walk each and every day, increasing distance each day. No lifting greater than 8 lbs.  No lifting no bending no twisting no driving or riding a car unless coming back and forth to see the doctor. If provided with back brace, wear when out of bed.  It is not necessary to wear brace in bed.  Diet Resume your normal diet.   Return to Work Will be discussed at your follow up appointment.  Call Your Doctor If Any of These Occur Redness, drainage, or swelling at the wound.  Temperature greater than 101 degrees. Severe pain not relieved by pain medication. Incision starts to come apart.  Follow Up Appt Call [phone] if you have one or any problem.

## 2023-10-24 NOTE — Anesthesia Postprocedure Evaluation (Signed)
 Anesthesia Post Note  Patient: Jason Harrison  Procedure(s) Performed: TRANSFORAMINAL LUMBAR INTERBODY FUSION (TLIF) WITH PEDICLE SCREW FIXATION LUMBAR THREE-FOUR FOUR-FIVE (Spine Lumbar)     Patient location during evaluation: PACU Anesthesia Type: General Level of consciousness: sedated and patient cooperative Pain management: pain level controlled Vital Signs Assessment: post-procedure vital signs reviewed and stable Respiratory status: spontaneous breathing Cardiovascular status: stable Anesthetic complications: no   No notable events documented.  Last Vitals:  Vitals:   10/23/23 2256 10/24/23 0331  BP: (!) 143/81 116/64  Pulse: (!) 105 97  Resp: 18 20  Temp: 36.8 C 37 C  SpO2: 99% 99%    Last Pain:  Vitals:   10/24/23 0647  TempSrc:   PainSc: 7                  Gorman Laughter

## 2023-10-24 NOTE — Discharge Summary (Signed)
 Physician Discharge Summary  Patient ID: Jason Harrison MRN: [id number] DOB/AGE: 72-19-1952 72 y.o. Estimated body mass index is 30.13 kg/m as calculated from the following:   Height as of this encounter: 5\' 10"  (1.778 m).   Weight as of this encounter: 95.3 kg.   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  Patient will discharge schedule follow-up in 1 to 2 weeks.  Consults: Significant Diagnostic Studies: Treatments: Transforaminal interbody fusions L3-4 L4-5 Discharge Exam: Blood pressure 111/69, pulse 92, temperature 97.8 F (36.6 C), temperature source Oral, resp. rate 17, height 5\' 10"  (1.778 m), weight 95.3 kg, SpO2 98%. Strength 5 out of 5 wound clean dry and intact  Disposition: Home   Allergies as of 10/24/2023       Reactions   Trulicity [dulaglutide] Itching, Swelling        Medication List     TAKE these medications    Alpha-Lipoic Acid 600 MG Caps Take 600 mg by mouth daily.   aspirin  EC 81 MG tablet Take 81 mg by mouth every evening.   atorvastatin  20 MG tablet Commonly known as: LIPITOR Take 20 mg by mouth every evening.   BD Pen Needle Nano 2nd Gen 32G X 4 MM Misc Generic drug: Insulin  Pen Needle USE 1 PEN NEEDLE ONCE DAILY   Biotin  10000 MCG Tabs Take 10,000 mcg by mouth daily.   brimonidine  0.2 % ophthalmic solution Commonly known as: ALPHAGAN  Place 1 drop into both eyes 2 (two) times daily.   cetirizine 10 MG tablet Commonly known as: ZYRTEC Take 10  mg by mouth every morning.   CHROMIUM PICOLATE PO Take 5,000 mcg by mouth daily.   cyclobenzaprine  10 MG tablet Commonly known as: FLEXERIL  Take 1 tablet (10 mg total) by mouth 3 (three) times daily as needed for muscle spasms.   dorzolamide -timolol  2-0.5 % ophthalmic solution Commonly known as: COSOPT  Place 1 drop into both eyes 2 (two) times daily.   Fish Oil 1000 MG Caps Take 1,000 mg by mouth daily.   glimepiride  1 MG tablet Commonly known as: AMARYL  Take 1 mg by mouth daily with breakfast.   HYDROcodone -acetaminophen  5-325 MG tablet Commonly known as: NORCO/VICODIN Take 2 tablets by mouth every 4 (four) hours as needed for severe pain (pain score 7-10).   Jardiance  25 MG Tabs tablet Generic drug: empagliflozin  Take 25 mg by mouth daily.   latanoprost  0.005 % ophthalmic solution Commonly known as: XALATAN  Place 1 drop into both eyes at bedtime.   LUBRICATING EYE DROPS OP Place 1 drop into both eyes daily as needed (Pollen).   melatonin 5 MG Tabs Take 2.5 mg by mouth at bedtime.   metFORMIN  1000 MG tablet Commonly known as: GLUCOPHAGE  Take 1,000 mg by mouth 2 (two) times daily.   multivitamin with minerals Tabs tablet Take 1 tablet by mouth daily.   Ozempic  (2 MG/DOSE) 8 MG/3ML Sopn Generic drug: Semaglutide  (2 MG/DOSE) Inject 2 mg of amoxicillin into the skin once a week.   pioglitazone  30 MG tablet Commonly known as: ACTOS  Take 30 mg by mouth daily.   PreserVision AREDS 2 Caps Take 1  capsule by mouth 2 (two) times daily.   Probiotic Caps Take 1 capsule by mouth daily.   tadalafil  5 MG tablet Commonly known as: CIALIS  Take 1 tablet (5 mg total) by mouth daily as needed for erectile dysfunction. What changed: when to take this   testosterone  cypionate 200 MG/ML injection Commonly known as: DEPOTESTOSTERONE CYPIONATE ADMINISTER 1 ML(200 MG) IN THE MUSCLE EVERY 14 DAYS   TURMERIC PO Take 320 mg by mouth daily.   vitamin C  1000 MG tablet Take  1,000 mg by mouth daily.   Vitamin D  50 MCG (2000 UT) tablet Take 2,000 Units by mouth daily.   VITAMIN K PO Take 1 capsule by mouth daily.         Signed: Ferris Hua 10/24/2023, 7:47 AM

## 2023-10-24 NOTE — Evaluation (Signed)
 Occupational Therapy Evaluation and Discharge Patient Details Name: Jason Harrison MRN: [id number] DOB: Nov 22, 1950 Today's Date: 10/24/2023   History of Present Illness   Pt is a 73 yo male s/p decompressive lumbar laminectomies with complete medial facetectomies and radical foraminotomies of the L3, L4 and L5 nerve roots at L3-4 and L4-5 and transforaminal interbody fusion L3-5. PHMx: DM2, diabetic retinopathy, MI     Clinical Impressions This 73 yo male admitted and underwent above presents to acute OT with all education completed and post op back handout provided. No further OT needs, we will sign off. No PT needs identified.     If plan is discharge home, recommend the following:   Assistance with cooking/housework;Assist for transportation     Functional Status Assessment   Patient has had a recent decline in their functional status and demonstrates the ability to make significant improvements in function in a reasonable and predictable amount of time. (without further need for skilled OT)     Equipment Recommendations   None recommended by OT      Precautions/Restrictions   Precautions Precautions: Back Precaution Booklet Issued: Yes (comment) Recall of Precautions/Restrictions: Intact Required Braces or Orthoses: Spinal Brace Spinal Brace: Lumbar corset;Applied in sitting position;Applied in standing position Restrictions Weight Bearing Restrictions Per Provider Order: No     Mobility Bed Mobility Overal bed mobility: Modified Independent                  Transfers Overall transfer level: Independent Equipment used: None                      Balance Overall balance assessment: Mild deficits observed, not formally tested                                         ADL either performed or assessed with clinical judgement   ADL                                         General ADL Comments:  Educated/provided handout on sequence of dressing, use of pillows for positioning in bed, building up sitting tolerance, use of wet wipes for back peri care, use of 2 cups for brushing teeth, sequence for in and OOB, sit<>stand keeping back straight     Vision Patient Visual Report: No change from baseline              Pertinent Vitals/Pain Pain Assessment Pain Assessment: 0-10 Pain Score: 3  Pain Location: incisional Pain Descriptors / Indicators: Aching, Sore Pain Intervention(s): Limited activity within patient's tolerance, Monitored during session, Repositioned     Extremity/Trunk Assessment Upper Extremity Assessment Upper Extremity Assessment: Overall WFL for tasks assessed           Communication Communication Communication: No apparent difficulties   Cognition Arousal: Alert Behavior During Therapy: WFL for tasks assessed/performed Cognition: No apparent impairments                               Following commands: Intact         General Comments      up and down 5 steps with one rail in a step-to pattern (Mod I)  Home Living Family/patient expects to be discharged to:: Private residence Living Arrangements: Alone Available Help at Discharge: Family;Available 24 hours/day (for several days) Type of Home: House Home Access: Stairs to enter [employer] of Steps: 5 Entrance Stairs-Rails: Right Home Layout: One level     Bathroom Shower/Tub: Producer, television/film/video: Handicapped height     Home Equipment: Shower seat - built in;Hand held shower head          Prior Functioning/Environment Prior Level of Function : Independent/Modified Independent;Driving;Working/employed                    OT Problem List: Decreased range of motion;Pain        OT Goals(Current goals can be found in the care plan section)   Acute Rehab OT Goals Patient Stated Goal: to go home today         AM-PAC OT  "6 Clicks" Daily Activity     Outcome Measure Help from another person eating meals?: None Help from another person taking care of personal grooming?: None Help from another person toileting, which includes using toliet, bedpan, or urinal?: None Help from another person bathing (including washing, rinsing, drying)?: None Help from another person to put on and taking off regular upper body clothing?: None Help from another person to put on and taking off regular lower body clothing?: None 6 Click Score: 24   End of Session Nurse Communication:  (no further OT needs)  Activity Tolerance: Patient tolerated treatment well Patient left: with family/visitor present (sitting EOB)  OT Visit Diagnosis: Pain Pain - part of body:  (incisioanl)                Time: 2130-8657 OT Time Calculation (min): 29 min Charges:  OT General Charges $OT Visit: 1 Visit OT Evaluation $OT Eval Moderate Complexity: 1 Mod OT Treatments $Self Care/Home Management : 8-22 mins  Merryl Abraham OT Acute Rehabilitation Services Office [phone]    Lenox Raider 10/24/2023, 9:24 AM

## 2023-10-24 NOTE — Progress Notes (Signed)
 Patient alert and oriented, mae's well, voiding adequate amount of urine, swallowing without difficulty, no c/o pain at time of discharge. Patient discharged home with family. Script and discharged instructions given to patient. Patient and family stated understanding of instructions given. Patient has an appointment with Dr. Wynetta Emery

## 2023-10-24 NOTE — Care Management CC44 (Signed)
 Condition Code 44 Documentation Completed  Patient Details  Name: Abbott Jasinski MRN: [id number] Date of Birth: 1950/10/25   Condition Code 44 given:  Yes Patient signature on Condition Code 44 notice:  Yes Documentation of 2 MD's agreement:  Yes Code 44 added to claim:  Yes    Jonathan Neighbor, RN 10/24/2023, 8:51 AM

## 2023-11-08 ENCOUNTER — Ambulatory Visit: Payer: BLUE CROSS/BLUE SHIELD | Admitting: Dermatology

## 2023-12-13 ENCOUNTER — Other Ambulatory Visit: Payer: Self-pay

## 2023-12-13 DIAGNOSIS — E291 Testicular hypofunction: Secondary | ICD-10-CM

## 2023-12-13 DIAGNOSIS — N400 Enlarged prostate without lower urinary tract symptoms: Secondary | ICD-10-CM

## 2023-12-14 ENCOUNTER — Ambulatory Visit: Payer: Self-pay | Admitting: Urology

## 2023-12-14 LAB — PSA: Prostate Specific Ag, Serum: 0.8 ng/mL (ref 0.0–4.0)

## 2023-12-14 LAB — HEMOGLOBIN AND HEMATOCRIT, BLOOD
Hematocrit: 43.2 % (ref 37.5–51.0)
Hemoglobin: 13.6 g/dL (ref 13.0–17.7)

## 2023-12-14 LAB — TESTOSTERONE: Testosterone: 341 ng/dL (ref 264–916)

## 2024-04-01 ENCOUNTER — Encounter: Payer: Self-pay | Admitting: Dermatology

## 2024-04-01 ENCOUNTER — Ambulatory Visit (INDEPENDENT_AMBULATORY_CARE_PROVIDER_SITE_OTHER): Admitting: Dermatology

## 2024-04-01 DIAGNOSIS — Z1283 Encounter for screening for malignant neoplasm of skin: Secondary | ICD-10-CM | POA: Diagnosis not present

## 2024-04-01 DIAGNOSIS — Z86018 Personal history of other benign neoplasm: Secondary | ICD-10-CM

## 2024-04-01 DIAGNOSIS — L821 Other seborrheic keratosis: Secondary | ICD-10-CM

## 2024-04-01 DIAGNOSIS — W908XXA Exposure to other nonionizing radiation, initial encounter: Secondary | ICD-10-CM | POA: Diagnosis not present

## 2024-04-01 DIAGNOSIS — D1801 Hemangioma of skin and subcutaneous tissue: Secondary | ICD-10-CM

## 2024-04-01 DIAGNOSIS — H61002 Unspecified perichondritis of left external ear: Secondary | ICD-10-CM

## 2024-04-01 DIAGNOSIS — L578 Other skin changes due to chronic exposure to nonionizing radiation: Secondary | ICD-10-CM

## 2024-04-01 DIAGNOSIS — L814 Other melanin hyperpigmentation: Secondary | ICD-10-CM

## 2024-04-01 DIAGNOSIS — D229 Melanocytic nevi, unspecified: Secondary | ICD-10-CM

## 2024-04-01 NOTE — Patient Instructions (Signed)

## 2024-04-01 NOTE — Progress Notes (Unsigned)
 Follow-Up Visit   Subjective  Jason Harrison is a 73 y.o. 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   The patient presents for Upper Body Skin Exam (UBSE) for skin cancer screening and mole check. The patient has spots, moles and lesions to be evaluated, some may be new or changing and the patient may have concern these could be cancer.  The following portions of the chart were reviewed this encounter and updated as appropriate: medications, allergies, medical history  Review of Systems:  No other skin or systemic complaints except as noted in HPI or Assessment and Plan.  Objective  Well appearing patient in no apparent distress; mood and affect are within normal limits.  All skin waist up examined. Relevant physical exam findings are noted in the Assessment and Plan.  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  Trauma or pressure from sleeping on the ear or from cell phone use and sun damage may be exacerbating factors.  Treatment may include using a C-shaped airplane neck pillow for sleeping on the side of the head so no pressure is on the ear.  Other treatments include topical or intralesional steroids; liquid nitrogen or laser destruction; shave removal or excision.  The condition can be difficult to treat and persist or  recur despite treatment.  Discussed if bothersome will treat with mometasone cream  Patient will call or send mychart.    Skin cancer screening performed today.  Actinic Damage - Chronic condition, secondary to cumulative UV/sun exposure - diffuse scaly erythematous macules with underlying dyspigmentation - Recommend daily broad spectrum sunscreen SPF 30+ to sun-exposed areas, reapply every 2 hours as needed.  - Staying in the shade or wearing long sleeves, sun glasses (UVA+UVB protection) and wide brim hats (4-inch brim around the entire circumference of the hat) are also recommended for sun protection.  - Call for new or changing lesions.  Lentigines, Seborrheic Keratoses, Hemangiomas - Benign normal skin lesions - Benign-appearing - Call for any changes  Melanocytic Nevi - Tan-brown and/or pink-flesh-colored symmetric macules and papules - Benign appearing on exam today - Observation - Call clinic for new or changing moles - Recommend daily use of broad spectrum spf 30+ sunscreen to sun-exposed areas.   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 Will continue to watch. See new photo in media Will recheck in 6 months  Recommend regular full body skin exams Recommend daily broad spectrum sunscreen SPF 30+ to sun-exposed areas, reapply every 2 hours as needed.  Call if any new or changing lesions are noted between office visits   Return for keep follow up as scheduled .  IEleanor Blush, CMA, am acting as scribe for Alm Rhyme, MD.  Documentation: I have reviewed the above documentation for accuracy and completeness, and I agree with the above.  Alm Rhyme, MD

## 2024-04-02 ENCOUNTER — Encounter: Payer: Self-pay | Admitting: Dermatology

## 2024-05-24 ENCOUNTER — Other Ambulatory Visit: Payer: Self-pay | Admitting: Urology

## 2024-05-24 DIAGNOSIS — N400 Enlarged prostate without lower urinary tract symptoms: Secondary | ICD-10-CM

## 2024-09-17 ENCOUNTER — Ambulatory Visit: Admitting: Dermatology
